# Patient Record
Sex: Female | Born: 1960
Health system: Southern US, Community
[De-identification: ages and names within clinical notes are randomized; demographics above are authoritative.]

## PROBLEM LIST (undated history)

## (undated) DIAGNOSIS — J309 Allergic rhinitis, unspecified: Secondary | ICD-10-CM

## (undated) DIAGNOSIS — E039 Hypothyroidism, unspecified: Secondary | ICD-10-CM

## (undated) DIAGNOSIS — E7800 Pure hypercholesterolemia, unspecified: Secondary | ICD-10-CM

## (undated) DIAGNOSIS — I10 Essential (primary) hypertension: Secondary | ICD-10-CM

## (undated) DIAGNOSIS — N189 Chronic kidney disease, unspecified: Secondary | ICD-10-CM

## (undated) DIAGNOSIS — D649 Anemia, unspecified: Secondary | ICD-10-CM

## (undated) DIAGNOSIS — I878 Other specified disorders of veins: Secondary | ICD-10-CM

## (undated) HISTORY — PX: ROTATOR CUFF REPAIR: SHX139

---

## 2004-01-16 ENCOUNTER — Ambulatory Visit: Payer: Self-pay | Admitting: Internal Medicine

## 2005-06-17 ENCOUNTER — Ambulatory Visit: Payer: Self-pay | Admitting: Physician Assistant

## 2005-07-06 ENCOUNTER — Ambulatory Visit: Payer: Self-pay | Admitting: Unknown Physician Specialty

## 2006-12-27 ENCOUNTER — Ambulatory Visit: Payer: Self-pay | Admitting: Internal Medicine

## 2007-12-29 ENCOUNTER — Ambulatory Visit: Payer: Self-pay | Admitting: Internal Medicine

## 2008-05-22 ENCOUNTER — Ambulatory Visit: Payer: Self-pay | Admitting: Internal Medicine

## 2008-12-30 ENCOUNTER — Ambulatory Visit: Payer: Self-pay | Admitting: Internal Medicine

## 2010-01-02 ENCOUNTER — Ambulatory Visit: Payer: Self-pay | Admitting: Internal Medicine

## 2010-09-17 ENCOUNTER — Ambulatory Visit: Payer: Self-pay | Admitting: Unknown Physician Specialty

## 2010-09-18 LAB — PATHOLOGY REPORT

## 2010-12-03 ENCOUNTER — Emergency Department: Payer: Self-pay | Admitting: Emergency Medicine

## 2011-01-26 ENCOUNTER — Ambulatory Visit: Payer: Self-pay | Admitting: Internal Medicine

## 2012-03-14 ENCOUNTER — Ambulatory Visit: Payer: Self-pay | Admitting: Internal Medicine

## 2013-03-08 HISTORY — PX: BREAST BIOPSY: SHX20

## 2013-03-26 ENCOUNTER — Ambulatory Visit: Payer: Self-pay | Admitting: Internal Medicine

## 2013-04-03 ENCOUNTER — Ambulatory Visit: Payer: Self-pay | Admitting: Internal Medicine

## 2013-09-18 ENCOUNTER — Ambulatory Visit: Payer: Self-pay | Admitting: Internal Medicine

## 2013-09-24 ENCOUNTER — Ambulatory Visit: Payer: Self-pay | Admitting: Internal Medicine

## 2013-09-26 LAB — PATHOLOGY REPORT

## 2013-10-11 ENCOUNTER — Ambulatory Visit: Payer: Self-pay | Admitting: Internal Medicine

## 2014-12-26 ENCOUNTER — Encounter: Payer: Self-pay | Admitting: *Deleted

## 2014-12-27 ENCOUNTER — Ambulatory Visit: Payer: 59 | Admitting: Anesthesiology

## 2014-12-27 ENCOUNTER — Encounter: Payer: Self-pay | Admitting: Anesthesiology

## 2014-12-27 ENCOUNTER — Ambulatory Visit
Admission: RE | Admit: 2014-12-27 | Discharge: 2014-12-27 | Disposition: A | Discharge status: Home / Self Care | Payer: 59 | Source: Ambulatory Visit | Attending: Unknown Physician Specialty | Admitting: Unknown Physician Specialty

## 2014-12-27 ENCOUNTER — Encounter
Admission: RE | Disposition: A | Discharge status: Home / Self Care | Payer: Self-pay | Source: Ambulatory Visit | Attending: Unknown Physician Specialty

## 2014-12-27 DIAGNOSIS — Z79899 Other long term (current) drug therapy: Secondary | ICD-10-CM | POA: Insufficient documentation

## 2014-12-27 DIAGNOSIS — I129 Hypertensive chronic kidney disease with stage 1 through stage 4 chronic kidney disease, or unspecified chronic kidney disease: Secondary | ICD-10-CM | POA: Diagnosis not present

## 2014-12-27 DIAGNOSIS — Z86010 Personal history of colonic polyps: Secondary | ICD-10-CM | POA: Diagnosis present

## 2014-12-27 DIAGNOSIS — E039 Hypothyroidism, unspecified: Secondary | ICD-10-CM | POA: Diagnosis not present

## 2014-12-27 DIAGNOSIS — Z888 Allergy status to other drugs, medicaments and biological substances status: Secondary | ICD-10-CM | POA: Diagnosis not present

## 2014-12-27 DIAGNOSIS — D123 Benign neoplasm of transverse colon: Secondary | ICD-10-CM | POA: Diagnosis not present

## 2014-12-27 DIAGNOSIS — Z880 Allergy status to penicillin: Secondary | ICD-10-CM | POA: Insufficient documentation

## 2014-12-27 DIAGNOSIS — N189 Chronic kidney disease, unspecified: Secondary | ICD-10-CM | POA: Diagnosis not present

## 2014-12-27 DIAGNOSIS — K640 First degree hemorrhoids: Secondary | ICD-10-CM | POA: Insufficient documentation

## 2014-12-27 DIAGNOSIS — J309 Allergic rhinitis, unspecified: Secondary | ICD-10-CM | POA: Diagnosis not present

## 2014-12-27 DIAGNOSIS — D120 Benign neoplasm of cecum: Secondary | ICD-10-CM | POA: Insufficient documentation

## 2014-12-27 DIAGNOSIS — Z87442 Personal history of urinary calculi: Secondary | ICD-10-CM | POA: Diagnosis not present

## 2014-12-27 DIAGNOSIS — E7800 Pure hypercholesterolemia, unspecified: Secondary | ICD-10-CM | POA: Diagnosis not present

## 2014-12-27 DIAGNOSIS — Z87891 Personal history of nicotine dependence: Secondary | ICD-10-CM | POA: Insufficient documentation

## 2014-12-27 HISTORY — PX: COLONOSCOPY WITH PROPOFOL: SHX5780

## 2014-12-27 HISTORY — DX: Allergic rhinitis, unspecified: J30.9

## 2014-12-27 HISTORY — DX: Pure hypercholesterolemia, unspecified: E78.00

## 2014-12-27 HISTORY — DX: Hypothyroidism, unspecified: E03.9

## 2014-12-27 HISTORY — DX: Anemia, unspecified: D64.9

## 2014-12-27 HISTORY — DX: Other specified disorders of veins: I87.8

## 2014-12-27 HISTORY — DX: Chronic kidney disease, unspecified: N18.9

## 2014-12-27 HISTORY — DX: Essential (primary) hypertension: I10

## 2014-12-27 SURGERY — COLONOSCOPY WITH PROPOFOL
Anesthesia: General

## 2014-12-27 MED ORDER — LIDOCAINE HCL (PF) 1 % IJ SOLN
INTRAMUSCULAR | Status: AC
  Administered 2014-12-27: 0.3 mL via INTRADERMAL
  Filled 2014-12-27: qty 2

## 2014-12-27 MED ORDER — LIDOCAINE HCL (PF) 1 % IJ SOLN
2.0000 mL | Freq: Once | INTRAMUSCULAR | Status: AC
  Administered 2014-12-27: 0.3 mL via INTRADERMAL

## 2014-12-27 MED ORDER — PROPOFOL 10 MG/ML IV BOLUS
INTRAVENOUS | Status: DC | PRN
  Administered 2014-12-27: 50 mg via INTRAVENOUS

## 2014-12-27 MED ORDER — FENTANYL CITRATE (PF) 100 MCG/2ML IJ SOLN
INTRAMUSCULAR | Status: DC | PRN
  Administered 2014-12-27: 50 ug via INTRAVENOUS

## 2014-12-27 MED ORDER — MIDAZOLAM HCL 5 MG/5ML IJ SOLN
INTRAMUSCULAR | Status: DC | PRN
  Administered 2014-12-27: 1 mg via INTRAVENOUS

## 2014-12-27 MED ORDER — SODIUM CHLORIDE 0.9 % IV SOLN
INTRAVENOUS | Status: DC
  Administered 2014-12-27: 1000 mL via INTRAVENOUS

## 2014-12-27 MED ORDER — PROPOFOL 500 MG/50ML IV EMUL
INTRAVENOUS | Status: DC | PRN
  Administered 2014-12-27: 100 ug/kg/min via INTRAVENOUS

## 2014-12-27 MED ORDER — LIDOCAINE HCL (PF) 2 % IJ SOLN
INTRAMUSCULAR | Status: DC | PRN
  Administered 2014-12-27: 50 mg

## 2014-12-27 NOTE — Transfer of Care (Signed)
Immediate Anesthesia Transfer of Care Note  Patient: Stephanie Boyle  Procedure(s) Performed: Procedure(s): COLONOSCOPY WITH PROPOFOL (N/A)  Patient Location: PACU  Anesthesia Type:General  Level of Consciousness: sedated  Airway & Oxygen Therapy: Patient Spontanous Breathing and Patient connected to nasal cannula oxygen  Post-op Assessment: Report given to RN and Post -op Vital signs reviewed and stable  Post vital signs: Reviewed and stable  Last Vitals:  Filed Vitals:   12/27/14 1450  BP: 115/72  Pulse: 69  Temp: 36.2 C  Resp:     Complications: No apparent anesthesia complications

## 2014-12-27 NOTE — Anesthesia Postprocedure Evaluation (Signed)
  Anesthesia Post-op Note  Patient: Stephanie Boyle  Procedure(s) Performed: Procedure(s): COLONOSCOPY WITH PROPOFOL (N/A)  Anesthesia type:General  Patient location: PACU  Post pain: Pain level controlled  Post assessment: Post-op Vital signs reviewed, Patient's Cardiovascular Status Stable, Respiratory Function Stable, Patent Airway and No signs of Nausea or vomiting  Post vital signs: Reviewed and stable  Last Vitals:  Filed Vitals:   12/27/14 1520  BP: 111/60  Pulse: 65  Temp:   Resp: 15    Level of consciousness: awake, alert  and patient cooperative  Complications: No apparent anesthesia complications

## 2014-12-27 NOTE — H&P (Signed)
   Primary Care Physician:  Idelle Crouch, MD Primary Gastroenterologist:  Dr. Vira Agar  Pre-Procedure History & Physical: HPI:  Stephanie Boyle is a 54 y.o. female is here for an colonoscopy.   Past Medical History  Diagnosis Date  . Allergic rhinitis   . Anemia     IDA  . Chronic kidney disease     Nephrolithiasis  . Hypothyroidism   . Venous stasis   . Hypertension   . Hypercholesterolemia     Past Surgical History  Procedure Laterality Date  . Cesarean section    . Rotator cuff repair      Prior to Admission medications   Medication Sig Start Date End Date Taking? Authorizing Provider  atorvastatin (LIPITOR) 20 MG tablet Take 20 mg by mouth daily.   Yes Historical Provider, MD  hydrochlorothiazide (HYDRODIURIL) 25 MG tablet Take 25 mg by mouth daily.   Yes Historical Provider, MD  levothyroxine (SYNTHROID, LEVOTHROID) 100 MCG tablet Take 100 mcg by mouth daily before breakfast.   Yes Historical Provider, MD  loratadine (CLARITIN) 10 MG tablet Take 10 mg by mouth daily.   Yes Historical Provider, MD  phentermine 37.5 MG capsule Take 37.5 mg by mouth every morning.   Yes Historical Provider, MD    Allergies as of 12/26/2014 - Review Complete 12/26/2014  Allergen Reaction Noted  . Penicillin v potassium Hives and Nausea And Vomiting 12/26/2014    History reviewed. No pertinent family history.  Social History   Social History  . Marital Status: Married    Spouse Name: N/A  . Number of Children: N/A  . Years of Education: N/A   Occupational History  . Not on file.   Social History Main Topics  . Smoking status: Former Research scientist (life sciences)  . Smokeless tobacco: Never Used  . Alcohol Use: No  . Drug Use: No  . Sexual Activity: Not on file   Other Topics Concern  . Not on file   Social History Narrative    Review of Systems: See HPI, otherwise negative ROS  Physical Exam: BP 146/85 mmHg  Pulse 99  Temp(Src) 98.4 F (36.9 C) (Tympanic)  Resp 17  Ht 5\' 1"   (1.549 m)  Wt 93.895 kg (207 lb)  BMI 39.13 kg/m2  SpO2 98% General:   Alert,  pleasant and cooperative in NAD Head:  Normocephalic and atraumatic. Neck:  Supple; no masses or thyromegaly. Lungs:  Clear throughout to auscultation.    Heart:  Regular rate and rhythm. Abdomen:  Soft, nontender and nondistended. Normal bowel sounds, without guarding, and without rebound.   Neurologic:  Alert and  oriented x4;  grossly normal neurologically.  Impression/Plan: Stephanie Boyle is here for an colonoscopy to be performed for  [facility] colon polyps  Risks, benefits, limitations, and alternatives regarding  colonoscopy have been reviewed with the patient.  Questions have been answered.  All parties agreeable.   Gaylyn Cheers, MD  12/27/2014, 2:17 PM

## 2014-12-27 NOTE — Op Note (Signed)
[facility] Gastroenterology Patient Name: Stephanie Boyle Procedure Date: 12/27/2014 2:18 PM MRN: [id number] Account #: [id number] Date of Birth: 05-05-1960 Admit Type: Outpatient Age: 54 Room: [facility] ENDO ROOM 1 Gender: Female Note Status: Finalized Procedure:         Colonoscopy Indications:       Personal history of colonic polyps Providers:         Manya Silvas, MD Referring MD:      Leonie Douglas. Doy Hutching, MD (Referring MD) Medicines:         Propofol per Anesthesia Complications:     No immediate complications. Procedure:         Pre-Anesthesia Assessment:                    - After reviewing the risks and benefits, the patient was                     deemed in satisfactory condition to undergo the procedure.                    After obtaining informed consent, the colonoscope was                     passed under direct vision. Throughout the procedure, the                     patient's blood pressure, pulse, and oxygen saturations                     were monitored continuously. The Colonoscope was                     introduced through the anus and advanced to the the cecum,                     identified by appendiceal orifice and ileocecal valve. The                     colonoscopy was performed without difficulty. The patient                     tolerated the procedure well. The quality of the bowel                     preparation was excellent. Findings:      A small polyp was found in the cecum. The polyp was sessile. The polyp       was removed with a cold snare. Resection and retrieval were complete.      A small-medium polyp was found in the distal transverse colon. The polyp       was sessile. The polyp was removed with a hot snare. Resection and       retrieval were complete.      Internal hemorrhoids were found during endoscopy. The hemorrhoids were       medium-sized and Grade I (internal hemorrhoids that do not prolapse). Impression:         - One small polyp in the cecum. Resected and retrieved.                    - One small polyp in the distal transverse colon. Resected                     and retrieved.                    -  Internal hemorrhoids. Recommendation:    - Await pathology results. Manya Silvas, MD 12/27/2014 2:47:21 PM This report has been signed electronically. Number of Addenda: 0 Note Initiated On: 12/27/2014 2:18 PM Scope Withdrawal Time: 0 hours 15 minutes 28 seconds  Total Procedure Duration: 0 hours 19 minutes 55 seconds       [facility]

## 2014-12-27 NOTE — Anesthesia Preprocedure Evaluation (Signed)
Anesthesia Evaluation  Patient identified by MRN, date of birth, ID band Patient awake    Reviewed: Allergy & Precautions, H&P , NPO status , Patient's Chart, lab work & pertinent test results, reviewed documented beta blocker date and time   History of Anesthesia Complications Negative for: history of anesthetic complications  Airway Mallampati: II  TM Distance: >3 FB Neck ROM: full    Dental no notable dental hx. (+) Teeth Intact   Pulmonary neg shortness of breath, asthma (no albuterol use in a year) , neg sleep apnea, neg COPD, neg recent URI, former smoker,    Pulmonary exam normal breath sounds clear to auscultation       Cardiovascular Exercise Tolerance: Good hypertension, On Medications (-) angina(-) CAD, (-) Past MI, (-) Cardiac Stents and (-) CABG Normal cardiovascular exam(-) dysrhythmias (-) Valvular Problems/Murmurs Rhythm:regular Rate:Normal     Neuro/Psych negative neurological ROS  negative psych ROS   GI/Hepatic negative GI ROS, Neg liver ROS,   Endo/Other  neg diabetesHypothyroidism Morbid obesity  Renal/GU Renal disease (kidney stones)  negative genitourinary   Musculoskeletal   Abdominal   Peds  Hematology negative hematology ROS (+)   Anesthesia Other Findings Past Medical History:   Allergic rhinitis                                            Anemia                                                         Comment:IDA   Chronic kidney disease                                         Comment:Nephrolithiasis   Hypothyroidism                                               Venous stasis                                                Hypertension                                                 Hypercholesterolemia                                         Reproductive/Obstetrics negative OB ROS                             Anesthesia Physical Anesthesia Plan  ASA:  III  Anesthesia Plan: General   Post-op Pain Management:    Induction:  Airway Management Planned:   Additional Equipment:   Intra-op Plan:   Post-operative Plan:   Informed Consent: I have reviewed the patients History and Physical, chart, labs and discussed the procedure including the risks, benefits and alternatives for the proposed anesthesia with the patient or authorized representative who has indicated his/her understanding and acceptance.   Dental Advisory Given  Plan Discussed with: Anesthesiologist, CRNA and Surgeon  Anesthesia Plan Comments:         Anesthesia Quick Evaluation

## 2014-12-31 LAB — SURGICAL PATHOLOGY

## 2015-01-02 ENCOUNTER — Encounter: Payer: Self-pay | Admitting: Unknown Physician Specialty

## 2015-04-30 ENCOUNTER — Other Ambulatory Visit: Payer: Self-pay | Admitting: Internal Medicine

## 2015-04-30 DIAGNOSIS — Z1231 Encounter for screening mammogram for malignant neoplasm of breast: Secondary | ICD-10-CM

## 2015-05-12 ENCOUNTER — Ambulatory Visit
Admission: RE | Admit: 2015-05-12 | Discharge: 2015-05-12 | Disposition: A | Discharge status: Home / Self Care | Payer: 59 | Source: Ambulatory Visit | Attending: Internal Medicine | Admitting: Internal Medicine

## 2015-05-12 DIAGNOSIS — Z1231 Encounter for screening mammogram for malignant neoplasm of breast: Secondary | ICD-10-CM | POA: Diagnosis present

## 2017-07-19 ENCOUNTER — Other Ambulatory Visit: Payer: Self-pay | Admitting: Internal Medicine

## 2017-07-19 DIAGNOSIS — Z1231 Encounter for screening mammogram for malignant neoplasm of breast: Secondary | ICD-10-CM

## 2017-08-05 ENCOUNTER — Ambulatory Visit
Admission: RE | Admit: 2017-08-05 | Discharge: 2017-08-05 | Disposition: A | Discharge status: Home / Self Care | Payer: BLUE CROSS/BLUE SHIELD | Source: Ambulatory Visit | Attending: Internal Medicine | Admitting: Internal Medicine

## 2017-08-05 DIAGNOSIS — Z1231 Encounter for screening mammogram for malignant neoplasm of breast: Secondary | ICD-10-CM | POA: Diagnosis present

## 2018-10-01 IMAGING — MG MM DIGITAL SCREENING BILAT W/ TOMO W/ CAD
8 series · 8 of 24 positions shown · non-contrast
Comparison: Previous exam(s).

CLINICAL DATA: Screening.

EXAM:
DIGITAL SCREENING BILATERAL MAMMOGRAM WITH TOMO AND CAD

[R CC synth-2D]
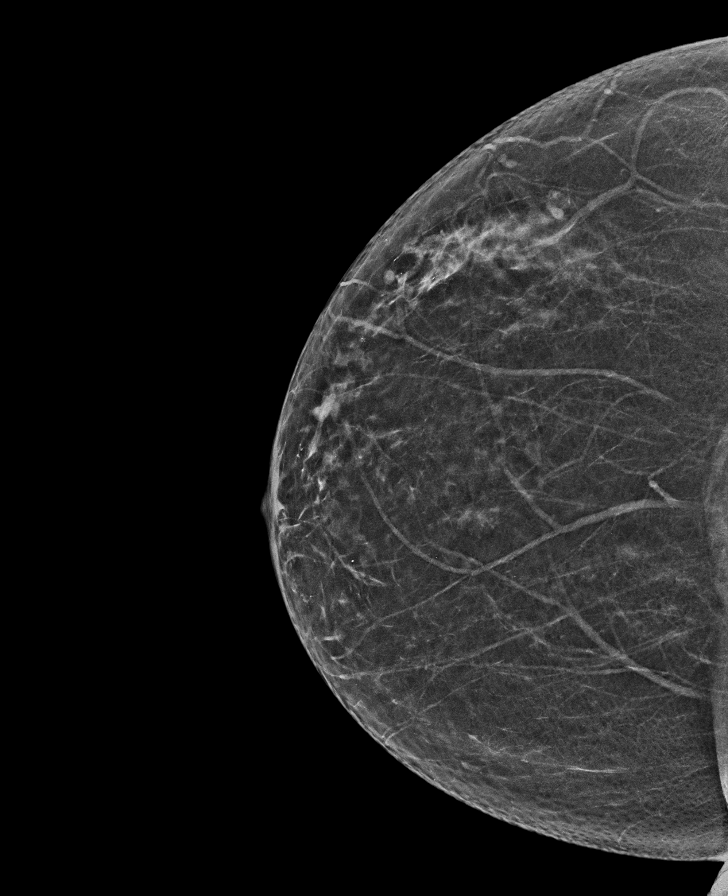

[L MLO synth-2D]
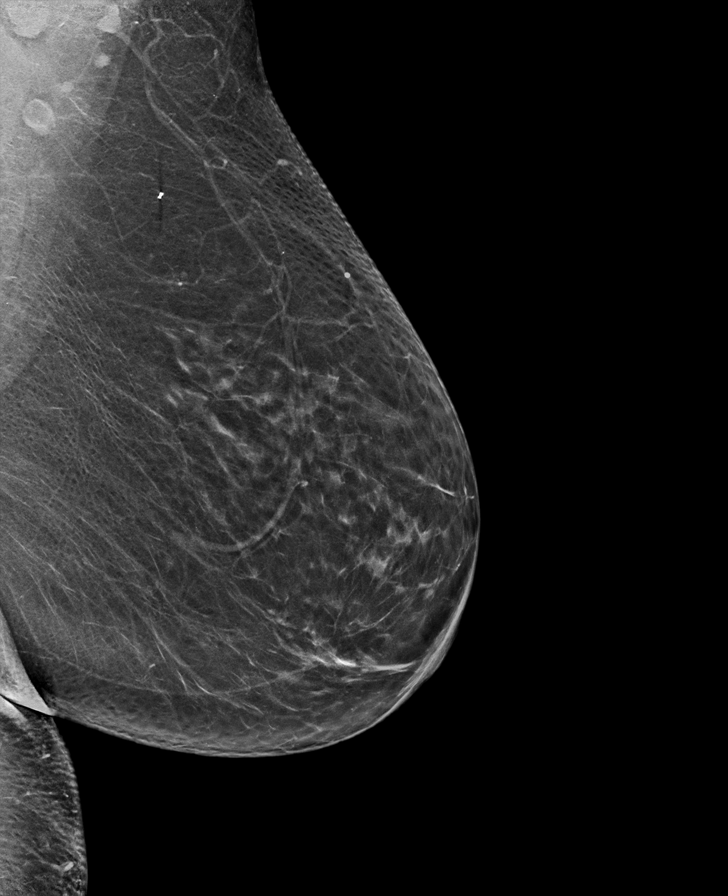

[R MLO synth-2D]
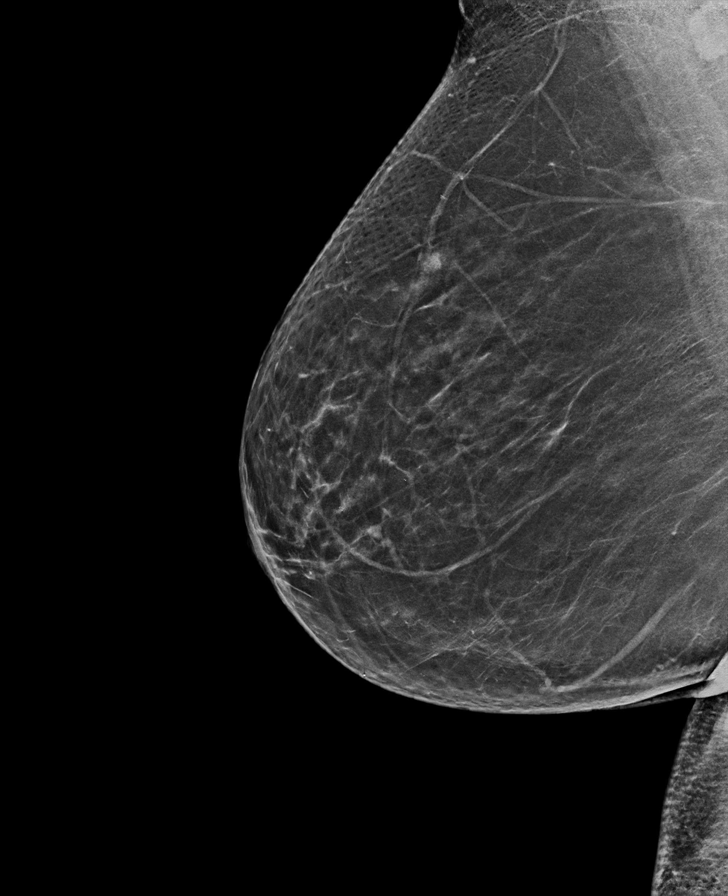

[L CC synth-2D]
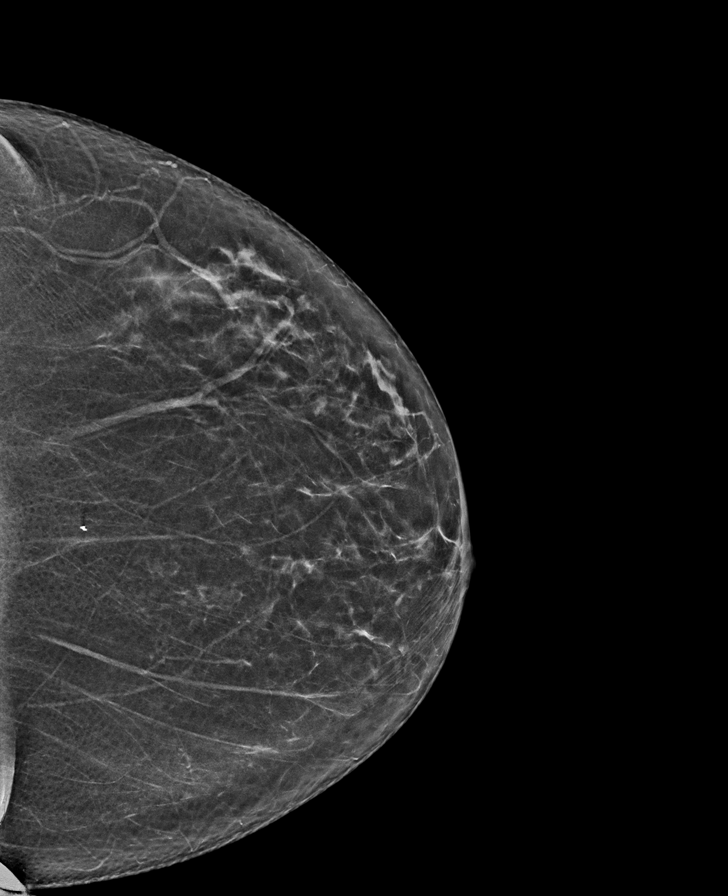

[R MLO tomo · tomo slice 35/68.0]
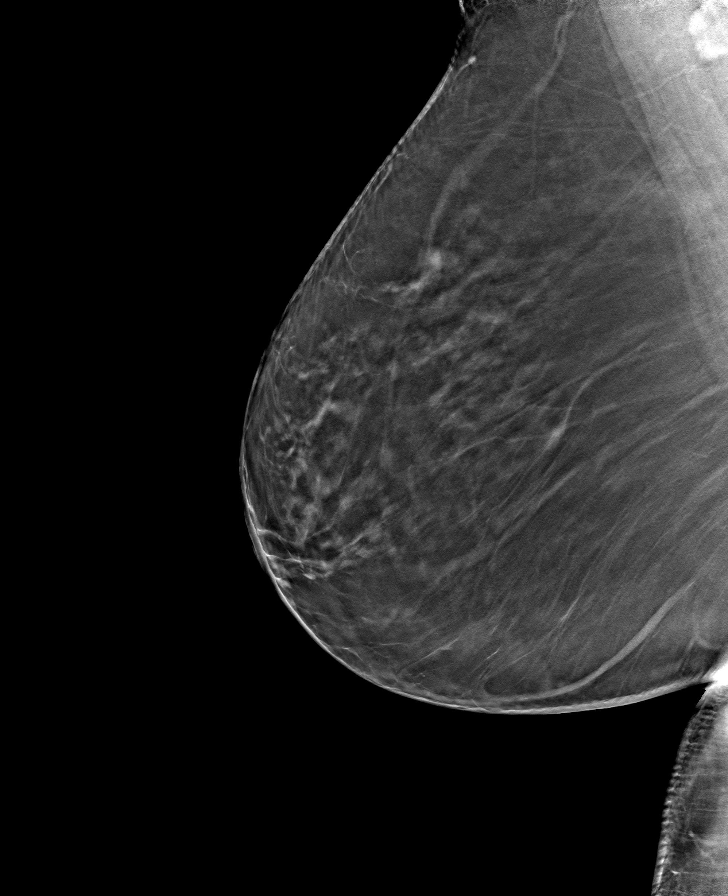

[R CC tomo · tomo slice 30/59.0]
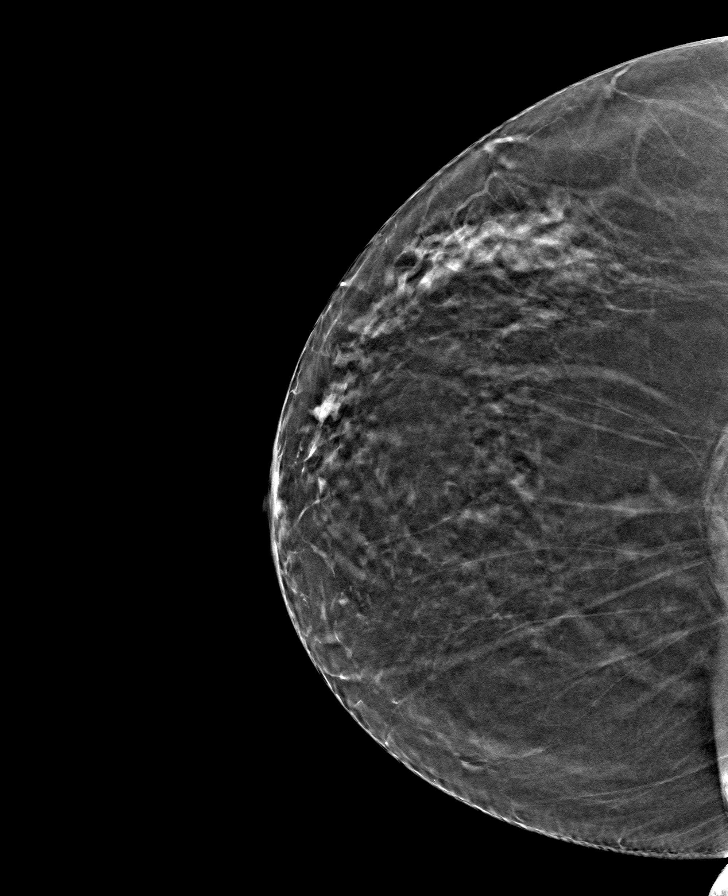

[L CC tomo · tomo slice 33/64.0]
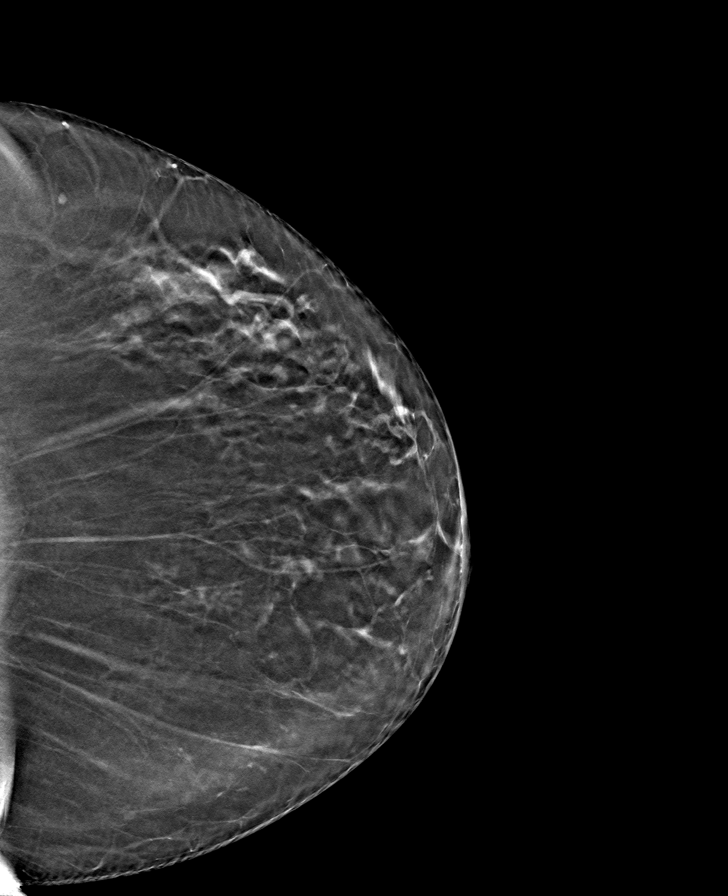

[L MLO tomo · tomo slice 36/71.0]
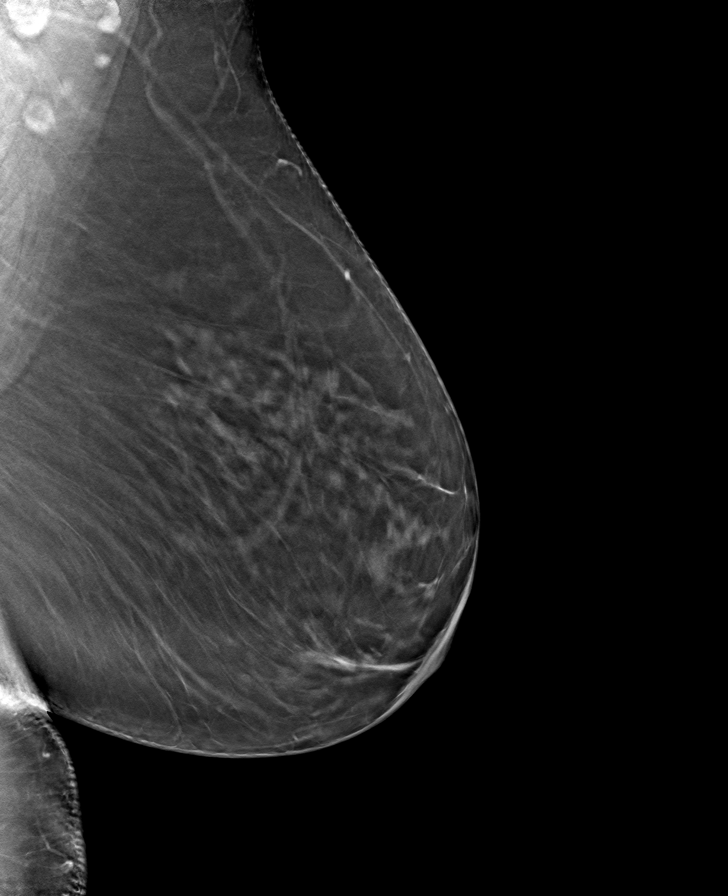

[8 of 24 positions shown; findings below may reference images not displayed]

ACR Breast Density Category b: There are scattered areas of
fibroglandular density.
FINDINGS: There are no findings suspicious for malignancy. Images were
processed with CAD.
IMPRESSION: No mammographic evidence of malignancy. A result letter of this
screening mammogram will be mailed directly to the patient.

RECOMMENDATION:
Screening mammogram in one year. (Code:[id number]-775)

BI-RADS CATEGORY  1: Negative.

## 2019-01-11 ENCOUNTER — Other Ambulatory Visit: Payer: Self-pay | Admitting: Internal Medicine

## 2019-01-11 DIAGNOSIS — Z1231 Encounter for screening mammogram for malignant neoplasm of breast: Secondary | ICD-10-CM

## 2019-01-26 ENCOUNTER — Encounter: Payer: Self-pay | Admitting: *Deleted

## 2019-03-05 ENCOUNTER — Other Ambulatory Visit: Payer: Self-pay

## 2019-03-05 ENCOUNTER — Telehealth: Payer: Self-pay

## 2019-03-05 DIAGNOSIS — Z1211 Encounter for screening for malignant neoplasm of colon: Secondary | ICD-10-CM

## 2019-03-05 DIAGNOSIS — Z800 Family history of malignant neoplasm of digestive organs: Secondary | ICD-10-CM

## 2019-03-05 DIAGNOSIS — K635 Polyp of colon: Secondary | ICD-10-CM

## 2019-03-05 MED ORDER — NA SULFATE-K SULFATE-MG SULF 17.5-3.13-1.6 GM/177ML PO SOLN: 1.0000 | Freq: Once | ORAL | 0 refills | Status: AC

## 2019-03-05 NOTE — Telephone Encounter (Signed)
Gastroenterology Pre-Procedure Review  Request Date: 03/21/18 Requesting Physician: Dr. Allen Norris  PATIENT REVIEW QUESTIONS: The patient responded to the following health history questions as indicated:    1. Are you having any GI issues? no 2. Do you have a personal history of Polyps? yes (2016 sessile polyp noted) 3. Do you have a family history of Colon Cancer or Polyps? yes (grandfather had colon cancer) 4. Diabetes Mellitus? no 5. Joint replacements in the past 12 months?no 6. Major health problems in the past 3 months?no 7. Any artificial heart valves, MVP, or defibrillator?no    MEDICATIONS & ALLERGIES:    Patient reports the following regarding taking any anticoagulation/antiplatelet therapy:   Plavix, Coumadin, Eliquis, Xarelto, Lovenox, Pradaxa, Brilinta, or Effient? no Aspirin? no  Patient confirms/reports the following medications:  Current Outpatient Medications  Medication Sig Dispense Refill  . atorvastatin (LIPITOR) 20 MG tablet Take 20 mg by mouth daily.    . hydrochlorothiazide (HYDRODIURIL) 25 MG tablet Take 25 mg by mouth daily.    Marland Kitchen levothyroxine (SYNTHROID, LEVOTHROID) 100 MCG tablet Take 100 mcg by mouth daily before breakfast.    . loratadine (CLARITIN) 10 MG tablet Take 10 mg by mouth daily.    . phentermine 37.5 MG capsule Take 37.5 mg by mouth every morning.     No current facility-administered medications for this visit.    Patient confirms/reports the following allergies:  Allergies  Allergen Reactions  . Penicillin V Potassium Hives and Nausea And Vomiting    No orders of the defined types were placed in this encounter.   AUTHORIZATION INFORMATION Primary Insurance: 1D#: Group #:  Secondary Insurance: 1D#: Group #:  SCHEDULE INFORMATION: Date:  Time: Location:

## 2019-03-05 NOTE — Telephone Encounter (Signed)
Patient LVM for me to call office in regard to scheduling her colonoscopy with Dr. Allen Norris on January 13th or January 14th.  January 14th Thursday is available with Dr. Allen Norris at [facility].  LVM for her to call me back to schedule.  Thanks [employer]

## 2019-03-19 ENCOUNTER — Other Ambulatory Visit
Admission: RE | Admit: 2019-03-19 | Discharge: 2019-03-19 | Disposition: A | Discharge status: Home / Self Care | Payer: 59 | Source: Ambulatory Visit | Attending: Gastroenterology | Admitting: Gastroenterology

## 2019-03-19 ENCOUNTER — Other Ambulatory Visit: Payer: Self-pay

## 2019-03-19 DIAGNOSIS — Z01812 Encounter for preprocedural laboratory examination: Secondary | ICD-10-CM | POA: Insufficient documentation

## 2019-03-19 DIAGNOSIS — Z20822 Contact with and (suspected) exposure to covid-19: Secondary | ICD-10-CM | POA: Insufficient documentation

## 2019-03-19 LAB — SARS CORONAVIRUS 2 (TAT 6-24 HRS): SARS Coronavirus 2: NEGATIVE

## 2019-03-20 ENCOUNTER — Other Ambulatory Visit: Payer: 59

## 2019-03-22 ENCOUNTER — Ambulatory Visit: Admit: 2019-03-22 | Payer: PRIVATE HEALTH INSURANCE | Admitting: Gastroenterology

## 2019-03-22 ENCOUNTER — Ambulatory Visit: Payer: 59 | Admitting: Certified Registered Nurse Anesthetist

## 2019-03-22 ENCOUNTER — Encounter
Admission: RE | Disposition: A | Discharge status: Home / Self Care | Payer: Self-pay | Source: Home / Self Care | Attending: Gastroenterology

## 2019-03-22 ENCOUNTER — Other Ambulatory Visit: Payer: Self-pay

## 2019-03-22 ENCOUNTER — Encounter: Payer: Self-pay | Admitting: Gastroenterology

## 2019-03-22 ENCOUNTER — Ambulatory Visit
Admission: RE | Admit: 2019-03-22 | Discharge: 2019-03-22 | Disposition: A | Discharge status: Home / Self Care | Payer: 59 | Attending: Gastroenterology | Admitting: Gastroenterology

## 2019-03-22 DIAGNOSIS — Z79890 Hormone replacement therapy (postmenopausal): Secondary | ICD-10-CM | POA: Insufficient documentation

## 2019-03-22 DIAGNOSIS — D124 Benign neoplasm of descending colon: Secondary | ICD-10-CM | POA: Insufficient documentation

## 2019-03-22 DIAGNOSIS — Z87891 Personal history of nicotine dependence: Secondary | ICD-10-CM | POA: Diagnosis not present

## 2019-03-22 DIAGNOSIS — N189 Chronic kidney disease, unspecified: Secondary | ICD-10-CM | POA: Diagnosis not present

## 2019-03-22 DIAGNOSIS — K635 Polyp of colon: Secondary | ICD-10-CM

## 2019-03-22 DIAGNOSIS — Z860100 Personal history of colon polyps, unspecified: Secondary | ICD-10-CM

## 2019-03-22 DIAGNOSIS — E7800 Pure hypercholesterolemia, unspecified: Secondary | ICD-10-CM | POA: Diagnosis not present

## 2019-03-22 DIAGNOSIS — Z79899 Other long term (current) drug therapy: Secondary | ICD-10-CM | POA: Insufficient documentation

## 2019-03-22 DIAGNOSIS — E039 Hypothyroidism, unspecified: Secondary | ICD-10-CM | POA: Diagnosis not present

## 2019-03-22 DIAGNOSIS — Z800 Family history of malignant neoplasm of digestive organs: Secondary | ICD-10-CM | POA: Diagnosis not present

## 2019-03-22 DIAGNOSIS — K648 Other hemorrhoids: Secondary | ICD-10-CM | POA: Insufficient documentation

## 2019-03-22 DIAGNOSIS — I129 Hypertensive chronic kidney disease with stage 1 through stage 4 chronic kidney disease, or unspecified chronic kidney disease: Secondary | ICD-10-CM | POA: Diagnosis not present

## 2019-03-22 HISTORY — PX: COLONOSCOPY WITH PROPOFOL: SHX5780

## 2019-03-22 SURGERY — COLONOSCOPY WITH PROPOFOL
Anesthesia: General

## 2019-03-22 MED ORDER — PROPOFOL 500 MG/50ML IV EMUL
INTRAVENOUS | Status: DC | PRN
  Administered 2019-03-22: 150 ug/kg/min via INTRAVENOUS

## 2019-03-22 MED ORDER — LIDOCAINE HCL (CARDIAC) PF 100 MG/5ML IV SOSY
PREFILLED_SYRINGE | INTRAVENOUS | Status: DC | PRN
  Administered 2019-03-22: 40 mg via INTRATRACHEAL

## 2019-03-22 MED ORDER — LIDOCAINE HCL (PF) 2 % IJ SOLN
INTRAMUSCULAR | Status: AC
  Filled 2019-03-22: qty 10

## 2019-03-22 MED ORDER — SODIUM CHLORIDE 0.9 % IV SOLN
INTRAVENOUS | Status: DC
  Administered 2019-03-22: 1000 mL via INTRAVENOUS

## 2019-03-22 MED ORDER — PROPOFOL 10 MG/ML IV BOLUS
INTRAVENOUS | Status: DC | PRN
  Administered 2019-03-22: 50 mg via INTRAVENOUS

## 2019-03-22 MED ORDER — PROPOFOL 10 MG/ML IV BOLUS
INTRAVENOUS | Status: AC
  Filled 2019-03-22: qty 20

## 2019-03-22 NOTE — Anesthesia Preprocedure Evaluation (Signed)
Anesthesia Evaluation  Patient identified by MRN, date of birth, ID band Patient awake    Reviewed: Allergy & Precautions, NPO status , Patient's Chart, lab work & pertinent test results  History of Anesthesia Complications Negative for: history of anesthetic complications  Airway Mallampati: I  TM Distance: >3 FB Neck ROM: Full    Dental no notable dental hx. (+) Teeth Intact   Pulmonary neg pulmonary ROS, neg sleep apnea, neg COPD, Patient abstained from smoking.Not current smoker, former smoker,    Pulmonary exam normal breath sounds clear to auscultation       Cardiovascular Exercise Tolerance: Good METShypertension, (-) CAD and (-) Past MI negative cardio ROS  (-) dysrhythmias  Rhythm:Regular Rate:Normal - Systolic murmurs    Neuro/Psych negative neurological ROS  negative psych ROS   GI/Hepatic neg GERD  ,(+)     (-) substance abuse  ,   Endo/Other  neg diabetesHypothyroidism   Renal/GU negative Renal ROS     Musculoskeletal   Abdominal   Peds  Hematology  (+) anemia ,   Anesthesia Other Findings Past Medical History: No date: Allergic rhinitis No date: Anemia     Comment:  IDA No date: Chronic kidney disease     Comment:  Nephrolithiasis No date: Hypercholesterolemia No date: Hypertension No date: Hypothyroidism No date: Venous stasis  Reproductive/Obstetrics                             Anesthesia Physical Anesthesia Plan  ASA: II  Anesthesia Plan: General   Post-op Pain Management:    Induction: Intravenous  PONV Risk Score and Plan: 3 and Ondansetron, Propofol infusion and TIVA  Airway Management Planned: Nasal Cannula  Additional Equipment: None  Intra-op Plan:   Post-operative Plan:   Informed Consent: I have reviewed the patients History and Physical, chart, labs and discussed the procedure including the risks, benefits and alternatives for the  proposed anesthesia with the patient or authorized representative who has indicated his/her understanding and acceptance.     Dental advisory given  Plan Discussed with: CRNA and Surgeon  Anesthesia Plan Comments: (Discussed risks of anesthesia with patient, including possibility of difficulty with spontaneous ventilation under anesthesia necessitating airway intervention, PONV, and rare risks such as cardiac or respiratory events. Patient understands.)        Anesthesia Quick Evaluation

## 2019-03-22 NOTE — Op Note (Signed)
[facility] Gastroenterology Patient Name: Stephanie Boyle Procedure Date: 03/22/2019 1:59 PM MRN: [id number] Account #: [id number] Date of Birth: 1960-12-22 Admit Type: Outpatient Age: 59 Room: [facility] ENDO ROOM 3 Gender: Female Note Status: Finalized Procedure:             Colonoscopy Indications:           High risk colon cancer surveillance: Personal history                         of adenomatous colonic polyps 12/27/2014 Providers:             Lucilla Lame MD, MD Referring MD:          Leonie Douglas. Doy Hutching, MD (Referring MD) Medicines:             Propofol per Anesthesia Complications:         No immediate complications. Procedure:             Pre-Anesthesia Assessment:                        - Prior to the procedure, a History and Physical was                         performed, and patient medications and allergies were                         reviewed. The patient's tolerance of previous                         anesthesia was also reviewed. The risks and benefits                         of the procedure and the sedation options and risks                         were discussed with the patient. All questions were                         answered, and informed consent was obtained. Prior                         Anticoagulants: The patient has taken no previous                         anticoagulant or antiplatelet agents. ASA Grade                         Assessment: II - A patient with mild systemic disease.                         After reviewing the risks and benefits, the patient                         was deemed in satisfactory condition to undergo the                         procedure.  After obtaining informed consent, the colonoscope was                         passed under direct vision. Throughout the procedure,                         the patient's blood pressure, pulse, and oxygen                         saturations were monitored  continuously. The                         Colonoscope was introduced through the anus and                         advanced to the the cecum, identified by appendiceal                         orifice and ileocecal valve. The colonoscopy was                         performed without difficulty. The patient tolerated                         the procedure well. The quality of the bowel                         preparation was excellent. Findings:      The perianal and digital rectal examinations were normal.      A 5 mm polyp was found in the descending colon. The polyp was sessile.       The polyp was removed with a cold snare. Resection and retrieval were       complete.      Non-bleeding internal hemorrhoids were found during retroflexion. The       hemorrhoids were Grade I (internal hemorrhoids that do not prolapse). Impression:            - One 5 mm polyp in the descending colon, removed with                         a cold snare. Resected and retrieved.                        - Non-bleeding internal hemorrhoids. Recommendation:        - Discharge patient to home.                        - Resume previous diet.                        - Continue present medications.                        - Await pathology results.                        - Repeat colonoscopy in 5 years for surveillance. Procedure Code(s):     --- Professional ---  45385, Colonoscopy, flexible; with removal of                         tumor(s), polyp(s), or other lesion(s) by snare                         technique Diagnosis Code(s):     --- Professional ---                        Z86.010, Personal history of colonic polyps                        K63.5, Polyp of colon CPT copyright 2019 American Medical Association. All rights reserved. The codes documented in this report are preliminary and upon coder review may  be revised to meet current compliance requirements. Lucilla Lame MD, MD 03/22/2019  2:22:14 PM This report has been signed electronically. Number of Addenda: 0 Note Initiated On: 03/22/2019 1:59 PM Scope Withdrawal Time: 0 hours 8 minutes 43 seconds  Total Procedure Duration: 0 hours 11 minutes 8 seconds  Estimated Blood Loss:  Estimated blood loss: none.      [facility]

## 2019-03-22 NOTE — H&P (Signed)
Stephanie Lame, MD [address] [place], [place] [address] Phone:[phone] Fax : [phone]  Primary Care Physician:  Idelle Crouch, MD Primary Gastroenterologist:  Dr. Allen Norris  Pre-Procedure History & Physical: HPI:  Stephanie Boyle is a 59 y.o. female is here for an colonoscopy.   Past Medical History:  Diagnosis Date  . Allergic rhinitis   . Anemia    IDA  . Chronic kidney disease    Nephrolithiasis  . Hypercholesterolemia   . Hypertension   . Hypothyroidism   . Venous stasis     Past Surgical History:  Procedure Laterality Date  . BREAST BIOPSY Left 2015   stereo - neg  . CESAREAN SECTION    . COLONOSCOPY WITH PROPOFOL N/A 12/27/2014   Procedure: COLONOSCOPY WITH PROPOFOL;  Surgeon: Manya Silvas, MD;  Location: [facility] ENDOSCOPY;  Service: Endoscopy;  Laterality: N/A;  . ROTATOR CUFF REPAIR      Prior to Admission medications   Medication Sig Start Date End Date Taking? Authorizing Provider  atorvastatin (LIPITOR) 20 MG tablet Take 20 mg by mouth daily.   Yes [provider]  hydrochlorothiazide (HYDRODIURIL) 25 MG tablet Take 25 mg by mouth daily.   Yes [provider]  levothyroxine (SYNTHROID, LEVOTHROID) 100 MCG tablet Take 100 mcg by mouth daily before breakfast.   Yes [provider]  loratadine (CLARITIN) 10 MG tablet Take 10 mg by mouth daily.   Yes [provider]  phentermine 37.5 MG capsule Take 37.5 mg by mouth every morning.   Yes [provider]    Allergies as of 03/05/2019 - Review Complete 12/27/2014  Allergen Reaction Noted  . Penicillin v potassium Hives and Nausea And Vomiting 12/26/2014    Family History  Problem Relation Age of Onset  . Breast cancer Cousin        maternal and paternal    Social History   Socioeconomic History  . Marital status: Married    Spouse name: Not on file  . Number of children: Not on file  . Years of education: Not on file  . Highest  education level: Not on file  Occupational History  . Not on file  Tobacco Use  . Smoking status: Former Research scientist (life sciences)  . Smokeless tobacco: Never Used  Substance and Sexual Activity  . Alcohol use: No  . Drug use: No  . Sexual activity: Not on file  Other Topics Concern  . Not on file  Social History Narrative  . Not on file   Social Determinants of Health   Financial Resource Strain:   . Difficulty of Paying Living Expenses: Not on file  Food Insecurity:   . Worried About Charity fundraiser in the Last Year: Not on file  . Ran Out of Food in the Last Year: Not on file  Transportation Needs:   . Lack of Transportation (Medical): Not on file  . Lack of Transportation (Non-Medical): Not on file  Physical Activity:   . Days of Exercise per Week: Not on file  . Minutes of Exercise per Session: Not on file  Stress:   . Feeling of Stress : Not on file  Social Connections:   . Frequency of Communication with Friends and Family: Not on file  . Frequency of Social Gatherings with Friends and Family: Not on file  . Attends Religious Services: Not on file  . Active Member of Clubs or Organizations: Not on file  . Attends Archivist Meetings:  Not on file  . Marital Status: Not on file  Intimate Partner Violence:   . Fear of Current or Ex-Partner: Not on file  . Emotionally Abused: Not on file  . Physically Abused: Not on file  . Sexually Abused: Not on file    Review of Systems: See HPI, otherwise negative ROS  Physical Exam: BP 133/83   Pulse 79   Temp 98.4 F (36.9 C) (Temporal)   Resp 18   Ht 5\' 1"  (1.549 m)   Wt 88.5 kg   SpO2 99%   BMI 36.84 kg/m  General:   Alert,  pleasant and cooperative in NAD Head:  Normocephalic and atraumatic. Neck:  Supple; no masses or thyromegaly. Lungs:  Clear throughout to auscultation.    Heart:  Regular rate and rhythm. Abdomen:  Soft, nontender and nondistended. Normal bowel sounds, without guarding, and without rebound.     Neurologic:  Alert and  oriented x4;  grossly normal neurologically.  Impression/Plan: Stephanie Boyle is here for an colonoscopy to be performed for history of adenomatous polyps  Risks, benefits, limitations, and alternatives regarding  colonoscopy have been reviewed with the patient.  Questions have been answered.  All parties agreeable.   Stephanie Lame, MD  03/22/2019, 2:31 PM

## 2019-03-22 NOTE — Transfer of Care (Signed)
Immediate Anesthesia Transfer of Care Note  Patient: Stephanie Boyle  Procedure(s) Performed: COLONOSCOPY WITH PROPOFOL (N/A )  Patient Location: PACU  Anesthesia Type:General  Level of Consciousness: awake, alert  and oriented  Airway & Oxygen Therapy: Patient Spontanous Breathing  Post-op Assessment: Report given to RN and Post -op Vital signs reviewed and stable  Post vital signs: Reviewed and stable  Last Vitals:  Vitals Value Taken Time  BP    Temp    Pulse 72 03/22/19 1424  Resp 17 03/22/19 1424  SpO2 99 % 03/22/19 1424  Vitals shown include unvalidated device data.  Last Pain:  Vitals:   03/22/19 1313  TempSrc: Temporal  PainSc: 0-No pain         Complications: No apparent anesthesia complications

## 2019-03-23 NOTE — Anesthesia Postprocedure Evaluation (Signed)
Anesthesia Post Note  Patient: Stephanie Boyle  Procedure(s) Performed: COLONOSCOPY WITH PROPOFOL (N/A )  Patient location during evaluation: Endoscopy Anesthesia Type: General Level of consciousness: awake and alert Pain management: pain level controlled Vital Signs Assessment: post-procedure vital signs reviewed and stable Respiratory status: spontaneous breathing, nonlabored ventilation, respiratory function stable and patient connected to nasal cannula oxygen Cardiovascular status: blood pressure returned to baseline and stable Postop Assessment: no apparent nausea or vomiting Anesthetic complications: no     Last Vitals:  Vitals:   03/22/19 1434 03/22/19 1454  BP:  128/80  Pulse:    Resp: 20   Temp:    SpO2:      Last Pain:  Vitals:   03/22/19 1454  TempSrc:   PainSc: 0-No pain                 Arita Miss

## 2019-03-26 LAB — SURGICAL PATHOLOGY

## 2019-03-27 ENCOUNTER — Encounter: Payer: Self-pay | Admitting: Gastroenterology

## 2019-03-29 ENCOUNTER — Ambulatory Visit
Admission: RE | Admit: 2019-03-29 | Discharge: 2019-03-29 | Disposition: A | Discharge status: Home / Self Care | Payer: 59 | Source: Ambulatory Visit | Attending: Internal Medicine | Admitting: Internal Medicine

## 2019-03-29 DIAGNOSIS — Z1231 Encounter for screening mammogram for malignant neoplasm of breast: Secondary | ICD-10-CM | POA: Diagnosis present

## 2019-09-22 ENCOUNTER — Ambulatory Visit: Payer: 59 | Attending: Internal Medicine

## 2019-09-22 DIAGNOSIS — Z23 Encounter for immunization: Secondary | ICD-10-CM

## 2019-09-22 NOTE — Progress Notes (Signed)
   Covid-19 Vaccination Clinic  Name:  Stephanie Boyle    MRN: [id number] DOB: 05-22-1960  09/22/2019  Stephanie Boyle was observed post Covid-19 immunization for 15 minutes without incident. She was provided with Vaccine Information Sheet and instruction to access the V-Safe system.   Stephanie Boyle was instructed to call 911 with any severe reactions post vaccine: Marland Kitchen Difficulty breathing  . Swelling of face and throat  . A fast heartbeat  . A bad rash all over body  . Dizziness and weakness   Immunizations Administered    Name Date Dose VIS Date Route   Pfizer COVID-19 Vaccine 09/22/2019 11:47 AM 0.3 mL 05/02/2018 Intramuscular   Manufacturer: [place]   Lot: [id number]   [place]: [address]-4

## 2019-10-15 ENCOUNTER — Ambulatory Visit: Payer: 59

## 2019-10-29 ENCOUNTER — Ambulatory Visit: Payer: Self-pay | Attending: Internal Medicine

## 2019-10-29 DIAGNOSIS — Z23 Encounter for immunization: Secondary | ICD-10-CM

## 2019-10-29 NOTE — Progress Notes (Signed)
   Covid-19 Vaccination Clinic  Name:  Stephanie Boyle    MRN: [id number] DOB: 1961-02-19  10/29/2019  [name] was observed post Covid-19 immunization for 15 minutes without incident. She was provided with Vaccine Information Sheet and instruction to access the V-Safe system.   [name] was instructed to call 911 with any severe reactions post vaccine: Marland Kitchen Difficulty breathing  . Swelling of face and throat  . A fast heartbeat  . A bad rash all over body  . Dizziness and weakness   Immunizations Administered    Name Date Dose VIS Date Route   Pfizer COVID-19 Vaccine 10/29/2019  9:14 AM 0.3 mL 05/02/2018 Intramuscular   Manufacturer: [employer]   Lot: [id number]   [place]: [address]-9

## 2020-01-23 ENCOUNTER — Other Ambulatory Visit: Payer: Self-pay | Admitting: Internal Medicine

## 2020-01-23 DIAGNOSIS — Z1231 Encounter for screening mammogram for malignant neoplasm of breast: Secondary | ICD-10-CM

## 2020-05-19 ENCOUNTER — Ambulatory Visit
Admission: RE | Admit: 2020-05-19 | Discharge: 2020-05-19 | Disposition: A | Discharge status: Home / Self Care | Payer: 59 | Source: Ambulatory Visit | Attending: Internal Medicine | Admitting: Internal Medicine

## 2020-05-19 ENCOUNTER — Other Ambulatory Visit: Payer: Self-pay

## 2020-05-19 DIAGNOSIS — Z1231 Encounter for screening mammogram for malignant neoplasm of breast: Secondary | ICD-10-CM | POA: Insufficient documentation

## 2021-09-02 ENCOUNTER — Other Ambulatory Visit: Payer: Self-pay | Admitting: Internal Medicine

## 2021-09-02 DIAGNOSIS — Z1231 Encounter for screening mammogram for malignant neoplasm of breast: Secondary | ICD-10-CM

## 2021-09-25 ENCOUNTER — Ambulatory Visit
Admission: RE | Admit: 2021-09-25 | Discharge: 2021-09-25 | Disposition: A | Discharge status: Home / Self Care | Payer: Commercial Managed Care - PPO | Source: Ambulatory Visit | Attending: Internal Medicine | Admitting: Internal Medicine

## 2021-09-25 DIAGNOSIS — Z1231 Encounter for screening mammogram for malignant neoplasm of breast: Secondary | ICD-10-CM | POA: Diagnosis not present

## 2022-09-14 ENCOUNTER — Other Ambulatory Visit: Payer: Self-pay | Admitting: Internal Medicine

## 2022-09-14 DIAGNOSIS — Z1231 Encounter for screening mammogram for malignant neoplasm of breast: Secondary | ICD-10-CM

## 2023-03-17 ENCOUNTER — Other Ambulatory Visit: Payer: Self-pay

## 2023-03-17 ENCOUNTER — Observation Stay
Admission: EM | Admit: 2023-03-17 | Discharge: 2023-03-18 | Disposition: A | Discharge status: Home / Self Care | Payer: Commercial Managed Care - PPO | Attending: Family Medicine | Admitting: Family Medicine

## 2023-03-17 ENCOUNTER — Emergency Department: Payer: Commercial Managed Care - PPO

## 2023-03-17 ENCOUNTER — Encounter: Payer: Self-pay | Admitting: Emergency Medicine

## 2023-03-17 DIAGNOSIS — Z87891 Personal history of nicotine dependence: Secondary | ICD-10-CM | POA: Insufficient documentation

## 2023-03-17 DIAGNOSIS — E785 Hyperlipidemia, unspecified: Secondary | ICD-10-CM | POA: Diagnosis present

## 2023-03-17 DIAGNOSIS — E039 Hypothyroidism, unspecified: Secondary | ICD-10-CM | POA: Diagnosis present

## 2023-03-17 DIAGNOSIS — N189 Chronic kidney disease, unspecified: Secondary | ICD-10-CM | POA: Insufficient documentation

## 2023-03-17 DIAGNOSIS — I160 Hypertensive urgency: Secondary | ICD-10-CM | POA: Diagnosis not present

## 2023-03-17 DIAGNOSIS — I129 Hypertensive chronic kidney disease with stage 1 through stage 4 chronic kidney disease, or unspecified chronic kidney disease: Secondary | ICD-10-CM | POA: Diagnosis not present

## 2023-03-17 DIAGNOSIS — E669 Obesity, unspecified: Secondary | ICD-10-CM | POA: Diagnosis present

## 2023-03-17 DIAGNOSIS — Z79899 Other long term (current) drug therapy: Secondary | ICD-10-CM | POA: Insufficient documentation

## 2023-03-17 DIAGNOSIS — E038 Other specified hypothyroidism: Secondary | ICD-10-CM

## 2023-03-17 DIAGNOSIS — I161 Hypertensive emergency: Secondary | ICD-10-CM | POA: Diagnosis present

## 2023-03-17 DIAGNOSIS — R0789 Other chest pain: Principal | ICD-10-CM | POA: Insufficient documentation

## 2023-03-17 DIAGNOSIS — R079 Chest pain, unspecified: Secondary | ICD-10-CM | POA: Diagnosis not present

## 2023-03-17 LAB — TROPONIN I (HIGH SENSITIVITY)
Troponin I (High Sensitivity): <2 ng/L (ref <18)
Troponin I (High Sensitivity): 3 ng/L (ref <18)

## 2023-03-17 LAB — BASIC METABOLIC PANEL
Anion gap: 10 (ref 5–15)
BUN: 11 mg/dL (ref 8–23)
CO2: 23 mmol/L (ref 22–32)
Calcium: 9.8 mg/dL (ref 8.9–10.3)
Chloride: 103 mmol/L (ref 98–111)
Creatinine, Ser: 0.68 mg/dL (ref 0.44–1.00)
GFR, Estimated: >60 mL/min (ref >60)
Glucose, Bld: 128 mg/dL — ABNORMAL HIGH (ref 70–99)
Potassium: 3.8 mmol/L (ref 3.5–5.1)
Sodium: 136 mmol/L (ref 135–145)

## 2023-03-17 LAB — CBC
HCT: 44.1 % (ref 36.0–46.0)
Hemoglobin: 15.3 g/dL — ABNORMAL HIGH (ref 12.0–15.0)
MCH: 29.7 pg (ref 26.0–34.0)
MCHC: 34.7 g/dL (ref 30.0–36.0)
MCV: 85.5 fL (ref 80.0–100.0)
Platelets: 239 10*3/uL (ref 150–400)
RBC: 5.16 MIL/uL — ABNORMAL HIGH (ref 3.87–5.11)
RDW: 11.7 % (ref 11.5–15.5)
WBC: 9.3 10*3/uL (ref 4.0–10.5)
nRBC: 0 % (ref 0.0–0.2)

## 2023-03-17 LAB — LIPID PANEL
Cholesterol: 171 mg/dL (ref 0–200)
HDL: 55 mg/dL (ref >40)
LDL Cholesterol: 102 mg/dL — ABNORMAL HIGH (ref 0–99)
Total CHOL/HDL Ratio: 3.1 {ratio}
Triglycerides: 68 mg/dL (ref <150)
VLDL: 14 mg/dL (ref 0–40)

## 2023-03-17 MED ORDER — MORPHINE SULFATE (PF) 2 MG/ML IV SOLN: 2.0000 mg | INTRAVENOUS | Status: DC | PRN

## 2023-03-17 MED ORDER — LABETALOL HCL 5 MG/ML IV SOLN
5.0000 mg | Freq: Once | INTRAVENOUS | Status: AC
  Administered 2023-03-17: 5 mg via INTRAVENOUS
  Filled 2023-03-17: qty 4

## 2023-03-17 MED ORDER — ONDANSETRON HCL 4 MG/2ML IJ SOLN: 4.0000 mg | Freq: Three times a day (TID) | INTRAMUSCULAR | Status: DC | PRN

## 2023-03-17 MED ORDER — ACETAMINOPHEN 325 MG PO TABS: 650.0000 mg | ORAL_TABLET | Freq: Four times a day (QID) | ORAL | Status: DC | PRN

## 2023-03-17 MED ORDER — AMLODIPINE BESYLATE 5 MG PO TABS
5.0000 mg | ORAL_TABLET | Freq: Every day | ORAL | Status: DC
  Administered 2023-03-17 – 2023-03-18 (×2): 5 mg via ORAL
  Filled 2023-03-17 (×2): qty 1

## 2023-03-17 MED ORDER — HYDRALAZINE HCL 20 MG/ML IJ SOLN: 5.0000 mg | INTRAMUSCULAR | Status: DC | PRN

## 2023-03-17 MED ORDER — ASPIRIN 81 MG PO TBEC
81.0000 mg | DELAYED_RELEASE_TABLET | Freq: Every day | ORAL | Status: DC
  Administered 2023-03-18: 81 mg via ORAL
  Filled 2023-03-17: qty 1

## 2023-03-17 MED ORDER — ENOXAPARIN SODIUM 40 MG/0.4ML IJ SOSY
40.0000 mg | PREFILLED_SYRINGE | INTRAMUSCULAR | Status: DC
  Filled 2023-03-17: qty 0.4

## 2023-03-17 MED ORDER — NITROGLYCERIN 0.4 MG SL SUBL
0.4000 mg | SUBLINGUAL_TABLET | SUBLINGUAL | Status: DC | PRN
  Administered 2023-03-17: 0.4 mg via SUBLINGUAL
  Filled 2023-03-17: qty 1

## 2023-03-17 MED ORDER — ASPIRIN 81 MG PO CHEW
324.0000 mg | CHEWABLE_TABLET | Freq: Once | ORAL | Status: AC
Start: 2023-03-17 — End: 2023-03-17
  Administered 2023-03-17: 324 mg via ORAL
  Filled 2023-03-17: qty 4

## 2023-03-17 NOTE — ED Triage Notes (Signed)
 Patient to ED via POV for generalized CP that started around 3pm. Pt states also having SOB and lightheadedness. Denies cardiac hx. States using diet control for HTN- does not take meds for same.

## 2023-03-17 NOTE — ED Provider Notes (Signed)
 [facility] Provider Note    Event Date/Time   First MD Initiated Contact with Patient 03/17/23 2153     (approximate)   History   Chest Pain   HPI  Stephanie Boyle is a 63 y.o. female history of chronic kidney disease and hypertension  Patient reports for about 3 or 4 days she has had fatigue.  No other symptoms then she went to work today and she started having a heavy chest pressure like a squeezing chest discomfort.  No nausea or vomiting.  Does not radiate it is relieved but still slightly present.  She has a distant history of smoking.  No shortness of breath.  No leg swelling no history of blood clots.  No sharp chest pain.  She notes a history of diet controlled hypertension history of hypothyroidism on treatment  No personal history of heart disease.  No headache numbness or weakness     Physical Exam   Triage Vital Signs: ED Triage Vitals  Encounter Vitals Group     BP 03/17/23 1634 (!) 210/84     Systolic BP Percentile --      Diastolic BP Percentile --      Pulse Rate 03/17/23 1634 76     Resp 03/17/23 1634 18     Temp 03/17/23 1634 97.7 F (36.5 C)     Temp Source 03/17/23 1634 Oral     SpO2 03/17/23 1634 100 %     Weight 03/17/23 1635 188 lb (85.3 kg)     Height 03/17/23 1635 5' 1 (1.549 m)     Head Circumference --      Peak Flow --      Pain Score 03/17/23 1635 7     Pain Loc --      Pain Education --      Exclude from Growth Chart --     Most recent vital signs: Vitals:   03/17/23 2245 03/17/23 2300  BP: (!) 165/72 (!) 175/72  Pulse: 64 68  Resp: 13 11  Temp:    SpO2: 100% 98%     General: Awake, no distress.  Very pleasant husband also at bedside CV:  Good peripheral perfusion.  Normal tones and rate.  Denies any chest pain that radiate to the back or radiate elsewhere.  It was a heavy chest pressure that is now mostly alleviated Resp:  Normal effort.  Clear lungs bilateral Abd:  No distention.   Other:  No lower extremity edema.  No abdominal discomfort to palpation   ED Results / Procedures / Treatments   Labs (all labs ordered are listed, but only abnormal results are displayed) Labs Reviewed  BASIC METABOLIC PANEL - Abnormal; Notable for the following components:      Result Value   Glucose, Bld 128 (*)    All other components within normal limits  CBC - Abnormal; Notable for the following components:   RBC 5.16 (*)    Hemoglobin 15.3 (*)    All other components within normal limits  TSH  T4, FREE  URINE DRUG SCREEN, QUALITATIVE ([facility] ONLY)  HEMOGLOBIN A1C  LIPID PANEL  HIV ANTIBODY (ROUTINE TESTING W REFLEX)  BASIC METABOLIC PANEL  APTT  PROTIME-INR  TROPONIN I (HIGH SENSITIVITY)  TROPONIN I (HIGH SENSITIVITY)  TROPONIN I (HIGH SENSITIVITY)     EKG  Is interpreted by me at 2155 Heart rate 70 QRS 80 QTc 410 Favored sinus rhythm without evidence of acute ischemia.  Slight artifact.  Do not see clear evidence of flutter though there is some artifact noted in V1 but in lead II and V5 I see no evidence of this.  Sinus rhythm     RADIOLOGY Chest x-ray inter by me is negative for acute   DG Chest 2 View Result Date: 03/17/2023 CLINICAL DATA:  Chest pain. EXAM: CHEST - 2 VIEW COMPARISON:  None Available. FINDINGS: With no focal consolidation, pleural effusion, or pneumothorax. The cardiac silhouette is within normal limits. No acute osseous pathology. IMPRESSION: No active cardiopulmonary disease. Electronically Signed   By: Vanetta Chou M.D.   On: 03/17/2023 17:12      PROCEDURES:  Critical Care performed: No  Procedures   MEDICATIONS ORDERED IN ED: Medications  nitroGLYCERIN  (NITROSTAT ) SL tablet 0.4 mg (0.4 mg Sublingual Given 03/17/23 2222)  hydrALAZINE  (APRESOLINE ) injection 5 mg (has no administration in time range)  labetalol  (NORMODYNE ) injection 5 mg (has no administration in time range)  aspirin  EC tablet 81 mg (has no administration in  time range)  amLODipine  (NORVASC ) tablet 5 mg (has no administration in time range)  enoxaparin  (LOVENOX ) injection 40 mg (has no administration in time range)  morphine  (PF) 2 MG/ML injection 2 mg (has no administration in time range)  ondansetron  (ZOFRAN ) injection 4 mg (has no administration in time range)  acetaminophen  (TYLENOL ) tablet 650 mg (has no administration in time range)  aspirin  chewable tablet 324 mg (324 mg Oral Given 03/17/23 2220)     IMPRESSION / MDM / ASSESSMENT AND PLAN / ED COURSE  I reviewed the triage vital signs and the nursing notes.                              Differential diagnosis includes, but is not limited to, ACS, aortic dissection, pulmonary embolism, cardiac tamponade, pneumothorax, pneumonia, pericarditis, myocarditis, GI-related causes including esophagitis/gastritis, and musculoskeletal chest wall pain.    Patient presented with markedly elevated blood pressure.  It is slightly downtrending currently 190/96 and she still has some vague sense of chest pressure.  No signs or symptoms of be highly suggestive of thromboembolism dissection.  No ripping tearing or moving pain.  Reassuring clinical assessment with no evidence of ischemia on ECG and initial and second troponin are normal.  She has severe hypertension.  I suspect she may be having hypertensive urgency she does have risk factors for coronary disease and her symptoms of fatigue for a few days followed by episode of heavy chest pressure I am inclined to observe her overnight for further testing and observation of her blood pressure as well as cardiac rule out.  ----------------------------------------- 10:34 PM on 03/17/2023 ----------------------------------------- Blood pressure now 159/97.  Chest discomfort has improved after nitroglycerin .  Continue to monitor will observe.  Consulted hospitalist for further cardiac and hypertension observation.  TSH and free T4 today pending.  At this time there  is no obvious evidence of ACS but unstable angina, anginal equivalent, coronary disease or hypertensive emergency seem moderately likely.  Patient's presentation is most consistent with acute complicated illness / injury requiring diagnostic workup.   The patient is on the cardiac monitor to evaluate for evidence of arrhythmia and/or significant heart rate changes.   Labs interpreted as normal troponin x 2  ----------------------------------------- 11:23 PM on 03/17/2023 ----------------------------------------- Consulted with and patient accepted to hospital service under Dr. Hilma for primary impression at this time hypertensive urgency with moderate risk coronary disease    Patient  and husband understanding agreeable plan for admission     FINAL CLINICAL IMPRESSION(S) / ED DIAGNOSES   Final diagnoses:  Chest pain with moderate risk for cardiac etiology  Hypertensive urgency     Rx / DC Orders   ED Discharge Orders     None        Note:  This document was prepared using Dragon voice recognition software and may include unintentional dictation errors.   Dicky Anes, MD 03/17/23 2324

## 2023-03-17 NOTE — ED Provider Triage Note (Signed)
 Emergency Medicine Provider Triage Evaluation Note  SARABELLE GENSON , a 63 y.o. female  was evaluated in triage.  Pt complains of chest pain, dizziness, shortness of breath Review of Systems  Positive:  Negative:   Physical Exam  BP (!) 210/84   Pulse 76   Temp 97.7 F (36.5 C) (Oral)   Resp 18   Ht 5' 1 (1.549 m)   Wt 85.3 kg   SpO2 100%   BMI 35.52 kg/m  Gen:   Awake, no distress   Resp:  Normal effort  MSK:   Moves extremities without difficulty  Other:    Medical Decision Making  Medically screening exam initiated at 4:46 PM.  Appropriate orders placed.  Othel CHRISTELLA Qian was informed that the remainder of the evaluation will be completed by another provider, this initial triage assessment does not replace that evaluation, and the importance of remaining in the ED until their evaluation is complete.  Patient with chest pain with EKG and labs   Janit Kast, PA-C 03/17/23 1653

## 2023-03-17 NOTE — ED Notes (Signed)
 Patient reports chest tightness has improved

## 2023-03-17 NOTE — H&P (Signed)
 History and Physical    Stephanie Boyle FMW:[id number] DOB: 06/30/1960 DOA: 03/17/2023  Referring MD/NP/PA:   PCP: Auston Reyes BIRCH, MD   Patient coming from:  The patient is coming from home.     Chief Complaint: chest pain  HPI: Stephanie Boyle is a 63 y.o. female with medical history significant of hypertension, hyperlipidemia, hypothyroidism, kidney stone, obesity, who presents with chest pain.  Patient states that her chest pain started this afternoon at about 3 PM, which is located in front chest, pressure and squeezing-like pain, severe, nonradiating.  Associated with mild shortness of breath.  No cough, fever or chills.  Patient has lightheadedness and dizziness.  No nausea, vomiting, diarrhea or abdominal pain.  No symptoms of UTI.  Patient states that she has fatigue in the past several days.  No unilateral numbness or tingling in extremities.  Patient states that she is currently not taking blood pressure medications. She is found to have elevated blood pressure 210/84 in ED.   Data reviewed independently and ED Course: pt was found to have troponin level negative x 3, blood pressure 9.3, GFR> 60, temperature normal, heart rate 84, RR 25, oxygen saturation 98% on room air.  Chest x-ray negative.  Patient is placed on telemetry bed for observation.   EKG: I have personally reviewed.  Sinus rhythm, QTc 408, LAD, poor R wave progression.  Review of Systems:   General: no fevers, chills, no body weight gain, has fatigue HEENT: no blurry vision, hearing changes or sore throat Respiratory: has dyspnea, no coughing, wheezing CV: has chest pain, no palpitations GI: no nausea, vomiting, abdominal pain, diarrhea, constipation GU: no dysuria, burning on urination, increased urinary frequency, hematuria  Ext: no leg edema Neuro: no unilateral weakness, numbness, or tingling, no vision change or hearing loss Skin: no rash, no skin tear. MSK: No muscle spasm, no deformity, no  limitation of range of movement in spin Heme: No easy bruising.  Travel history: No recent long distant travel.   Allergy:  Allergies  Allergen Reactions   Penicillin V Potassium Hives and Nausea And Vomiting    Past Medical History:  Diagnosis Date   Allergic rhinitis    Anemia    IDA   Chronic kidney disease    Nephrolithiasis   Hypercholesterolemia    Hypertension    Hypothyroidism    Venous stasis     Past Surgical History:  Procedure Laterality Date   BREAST BIOPSY Left 2015   stereo - neg   CESAREAN SECTION     COLONOSCOPY WITH PROPOFOL  N/A 12/27/2014   Procedure: COLONOSCOPY WITH PROPOFOL ;  Surgeon: Lamar ONEIDA Holmes, MD;  Location[facility] ENDOSCOPY;  Service: Endoscopy;  Laterality: N/A;   COLONOSCOPY WITH PROPOFOL  N/A 03/22/2019   Procedure: COLONOSCOPY WITH PROPOFOL ;  Surgeon: Jinny Carmine, MD;  Location: [facility] ENDOSCOPY;  Service: Endoscopy;  Laterality: N/A;   ROTATOR CUFF REPAIR      Social History:  reports that she has quit smoking. She has never used smokeless tobacco. She reports that she does not drink alcohol and does not use drugs.  Family History:  Family History  Problem Relation Age of Onset   Breast cancer Cousin        maternal and paternal     Prior to Admission medications   Medication Sig Start Date End Date Taking? Authorizing Provider  atorvastatin  (LIPITOR) 20 MG tablet Take 20 mg by mouth daily.    [provider]  hydrochlorothiazide (HYDRODIURIL) 25 MG tablet  Take 25 mg by mouth daily.    [provider]  levothyroxine  (SYNTHROID , LEVOTHROID) 100 MCG tablet Take 100 mcg by mouth daily before breakfast.    [provider]  loratadine (CLARITIN) 10 MG tablet Take 10 mg by mouth daily.    [provider]  phentermine 37.5 MG capsule Take 37.5 mg by mouth every morning.    [provider]    Physical Exam: Vitals:   03/17/23 2340 03/17/23 2345 03/18/23 0000 03/18/23 0015  BP: (!) 191/80 (!)  190/85 (!) 180/85 (!) 142/74  Pulse: 63 62 68 71  Resp: 13 15 14 17   Temp:      TempSrc:      SpO2: 100% 100% 100% 100%  Weight:      Height:       General: Not in acute distress HEENT:       Eyes: PERRL, EOMI, no jaundice       ENT: No discharge from the ears and nose, no pharynx injection, no tonsillar enlargement.        Neck: No JVD, no bruit, no mass felt. Heme: No neck lymph node enlargement. Cardiac: S1/S2, RRR, No murmurs, No gallops or rubs. Respiratory: No rales, wheezing, rhonchi or rubs. GI: Soft, nondistended, nontender, no rebound pain, no organomegaly, BS present. GU: No hematuria Ext: No pitting leg edema bilaterally. 1+DP/PT pulse bilaterally. Musculoskeletal: No joint deformities, No joint redness or warmth, no limitation of ROM in spin. Skin: No rashes.  Neuro: Alert, oriented X3, cranial nerves II-XII grossly intact, moves all extremities normally.  Psych: Patient is not psychotic, no suicidal or hemocidal ideation.  Labs on Admission: I have personally reviewed following labs and imaging studies  CBC: Recent Labs  Lab 03/17/23 1638  WBC 9.3  HGB 15.3*  HCT 44.1  MCV 85.5  PLT 239   Basic Metabolic Panel: Recent Labs  Lab 03/17/23 1638  NA 136  K 3.8  CL 103  CO2 23  GLUCOSE 128*  BUN 11  CREATININE 0.68  CALCIUM  9.8   GFR: Estimated Creatinine Clearance: 71.4 mL/min (by C-G formula based on SCr of 0.68 mg/dL). Liver Function Tests: No results for input(s): AST, ALT, ALKPHOS, BILITOT, PROT, ALBUMIN in the last 168 hours. No results for input(s): LIPASE, AMYLASE in the last 168 hours. No results for input(s): AMMONIA in the last 168 hours. Coagulation Profile: Recent Labs  Lab 03/17/23 2338  INR 1.1   Cardiac Enzymes: No results for input(s): CKTOTAL, CKMB, CKMBINDEX, TROPONINI in the last 168 hours. BNP (last 3 results) No results for input(s): PROBNP in the last 8760 hours. HbA1C: No results for  input(s): HGBA1C in the last 72 hours. CBG: No results for input(s): GLUCAP in the last 168 hours. Lipid Profile: Recent Labs    03/17/23 2030  CHOL 171  HDL 55  LDLCALC 102*  TRIG 68  CHOLHDL 3.1   Thyroid  Function Tests: Recent Labs    03/17/23 2215  TSH 2.493  FREET4 1.30*   Anemia Panel: No results for input(s): VITAMINB12, FOLATE, FERRITIN, TIBC, IRON, RETICCTPCT in the last 72 hours. Urine analysis: No results found for: COLORURINE, APPEARANCEUR, LABSPEC, PHURINE, GLUCOSEU, HGBUR, BILIRUBINUR, KETONESUR, PROTEINUR, UROBILINOGEN, NITRITE, LEUKOCYTESUR Sepsis Labs: @LABRCNTIP (procalcitonin:4,lacticidven:4) )No results found for this or any previous visit (from the past 240 hours).   Radiological Exams on Admission:   Assessment/Plan Principal Problem:   Chest pain Active Problems:   Hypertensive emergency   Hypothyroidism   HLD (hyperlipidemia)   Obesity (BMI  30-39.9)   Assessment and Plan:  Chest pain: Most likely due to hypertensive urgency.  Troponin negative x 3.  Patient's blood pressure has improved after treated with labetalol  IV.  Her shortness of breath and chest pain have resolved.  -Placed on telemetry bed for observation -As needed nitroglycerin , morphine  -Aspirin  and Lipitor -Check A1c, FP -Check UDS  Hypertensive emergency: Blood pressure 120/84 --> 152/79 -Give 5 mg of IV labetalol  -Start amlodipine  5 mg daily -IV hydralazine  as needed  Hypothyroidism: Patient states that her Synthroid  dose has recently been increased from 100 to 150 mcg.  Her TSH is normal 2.493, T4 slightly elevated 130. -Continue home Synthroid  -Follow-up with PCP to repeat TSH and free T4  HLD (hyperlipidemia) -Lipitor  Obesity (BMI 30-39.9): Patient states she is taking healthy diet. -Encourage losing weight -Exercise and healthy diet     DVT ppx: SQ Lovenox   Code Status: Full code    Family Communication: not  done, no family member is at bed side  Disposition Plan:  Anticipate discharge back to previous environment  Consults called:  none  Admission status and Level of care: Telemetry Medical:    for obs      Dispo: The patient is from: Home              Anticipated d/c is to: Home              Anticipated d/c date is: 1 day              Patient currently is not medically stable to d/c.    Severity of Illness:  The appropriate patient status for this patient is OBSERVATION. Observation status is judged to be reasonable and necessary in order to provide the required intensity of service to ensure the patient's safety. The patient's presenting symptoms, physical exam findings, and initial radiographic and laboratory data in the context of their medical condition is felt to place them at decreased risk for further clinical deterioration. Furthermore, it is anticipated that the patient will be medically stable for discharge from the hospital within 2 midnights of admission.        Date of Service 03/18/2023    Caleb Exon [facility]   If 7PM-7AM, please contact night-coverage [email or web address] 03/18/2023, 1:04 AM

## 2023-03-18 ENCOUNTER — Observation Stay (HOSPITAL_BASED_OUTPATIENT_CLINIC_OR_DEPARTMENT_OTHER)
Admit: 2023-03-18 | Discharge: 2023-03-18 | Disposition: A | Discharge status: Home / Self Care | Payer: Commercial Managed Care - PPO | Attending: Family Medicine | Admitting: Family Medicine

## 2023-03-18 ENCOUNTER — Other Ambulatory Visit: Payer: Self-pay

## 2023-03-18 DIAGNOSIS — I161 Hypertensive emergency: Secondary | ICD-10-CM | POA: Diagnosis not present

## 2023-03-18 DIAGNOSIS — E669 Obesity, unspecified: Secondary | ICD-10-CM | POA: Diagnosis not present

## 2023-03-18 DIAGNOSIS — R9431 Abnormal electrocardiogram [ECG] [EKG]: Secondary | ICD-10-CM | POA: Diagnosis not present

## 2023-03-18 DIAGNOSIS — E038 Other specified hypothyroidism: Secondary | ICD-10-CM | POA: Diagnosis not present

## 2023-03-18 LAB — URINE DRUG SCREEN, QUALITATIVE (ARMC ONLY)
Amphetamines, Ur Screen: NOT DETECTED
Barbiturates, Ur Screen: NOT DETECTED
Benzodiazepine, Ur Scrn: NOT DETECTED
Cannabinoid 50 Ng, Ur ~~LOC~~: NOT DETECTED
Cocaine Metabolite,Ur ~~LOC~~: NOT DETECTED
MDMA (Ecstasy)Ur Screen: NOT DETECTED
Methadone Scn, Ur: NOT DETECTED
Opiate, Ur Screen: NOT DETECTED
Phencyclidine (PCP) Ur S: NOT DETECTED
Tricyclic, Ur Screen: NOT DETECTED

## 2023-03-18 LAB — CBC WITH DIFFERENTIAL/PLATELET
Abs Immature Granulocytes: 0.02 10*3/uL (ref 0.00–0.07)
Basophils Absolute: 0 10*3/uL (ref 0.0–0.1)
Basophils Relative: 1 %
Eosinophils Absolute: 0.1 10*3/uL (ref 0.0–0.5)
Eosinophils Relative: 2 %
HCT: 43.5 % (ref 36.0–46.0)
Hemoglobin: 14.6 g/dL (ref 12.0–15.0)
Immature Granulocytes: 0 %
Lymphocytes Relative: 21 %
Lymphs Abs: 1.5 10*3/uL (ref 0.7–4.0)
MCH: 29.8 pg (ref 26.0–34.0)
MCHC: 33.6 g/dL (ref 30.0–36.0)
MCV: 88.8 fL (ref 80.0–100.0)
Monocytes Absolute: 0.5 10*3/uL (ref 0.1–1.0)
Monocytes Relative: 7 %
Neutro Abs: 5 10*3/uL (ref 1.7–7.7)
Neutrophils Relative %: 69 %
Platelets: 229 10*3/uL (ref 150–400)
RBC: 4.9 MIL/uL (ref 3.87–5.11)
RDW: 11.9 % (ref 11.5–15.5)
WBC: 7.2 10*3/uL (ref 4.0–10.5)
nRBC: 0 % (ref 0.0–0.2)

## 2023-03-18 LAB — ECHOCARDIOGRAM COMPLETE
AR max vel: 2.73 cm2
AV Area VTI: 2.31 cm2
AV Area mean vel: 2.51 cm2
AV Mean grad: 3 mm[Hg]
AV Peak grad: 5.1 mm[Hg]
Ao pk vel: 1.13 m/s
Area-P 1/2: 2.83 cm2
Calc EF: 58.5 %
Height: 61 in
MV VTI: 1.89 cm2
S' Lateral: 2.8 cm
Single Plane A2C EF: 59.1 %
Single Plane A4C EF: 57.9 %
Weight: 3008 [oz_av]

## 2023-03-18 LAB — HIV ANTIBODY (ROUTINE TESTING W REFLEX): HIV Screen 4th Generation wRfx: NONREACTIVE

## 2023-03-18 LAB — COMPREHENSIVE METABOLIC PANEL
ALT: 41 U/L (ref 0–44)
AST: 38 U/L (ref 15–41)
Albumin: 4 g/dL (ref 3.5–5.0)
Alkaline Phosphatase: 70 U/L (ref 38–126)
Anion gap: 11 (ref 5–15)
BUN: 11 mg/dL (ref 8–23)
CO2: 22 mmol/L (ref 22–32)
Calcium: 9 mg/dL (ref 8.9–10.3)
Chloride: 105 mmol/L (ref 98–111)
Creatinine, Ser: 0.62 mg/dL (ref 0.44–1.00)
GFR, Estimated: >60 mL/min (ref >60)
Glucose, Bld: 105 mg/dL — ABNORMAL HIGH (ref 70–99)
Potassium: 3.7 mmol/L (ref 3.5–5.1)
Sodium: 138 mmol/L (ref 135–145)
Total Bilirubin: 1.3 mg/dL — ABNORMAL HIGH (ref 0.0–1.2)
Total Protein: 7.1 g/dL (ref 6.5–8.1)

## 2023-03-18 LAB — PROTIME-INR
INR: 1.1 (ref 0.8–1.2)
Prothrombin Time: 14.5 s (ref 11.4–15.2)

## 2023-03-18 LAB — APTT: aPTT: 29 s (ref 24–36)

## 2023-03-18 LAB — TSH: TSH: 2.493 u[IU]/mL (ref 0.350–4.500)

## 2023-03-18 LAB — TROPONIN I (HIGH SENSITIVITY)
Troponin I (High Sensitivity): 3 ng/L (ref <18)
Troponin I (High Sensitivity): <2 ng/L (ref <18)

## 2023-03-18 LAB — HEMOGLOBIN A1C
Hgb A1c MFr Bld: 4.9 % (ref 4.8–5.6)
Mean Plasma Glucose: 93.93 mg/dL

## 2023-03-18 LAB — T4, FREE: Free T4: 1.3 ng/dL — ABNORMAL HIGH (ref 0.61–1.12)

## 2023-03-18 MED ORDER — ATORVASTATIN CALCIUM 20 MG PO TABS
20.0000 mg | ORAL_TABLET | Freq: Every day | ORAL | Status: DC
  Administered 2023-03-18: 20 mg via ORAL
  Filled 2023-03-18: qty 1

## 2023-03-18 MED ORDER — AMLODIPINE BESYLATE 5 MG PO TABS: 5.0000 mg | ORAL_TABLET | Freq: Every day | ORAL | 0 refills | Status: AC

## 2023-03-18 MED ORDER — LEVOTHYROXINE SODIUM 50 MCG PO TABS: 150.0000 ug | ORAL_TABLET | Freq: Every day | ORAL | Status: DC

## 2023-03-18 MED ORDER — ASPIRIN 81 MG PO TBEC: 81.0000 mg | DELAYED_RELEASE_TABLET | Freq: Every day | ORAL | 12 refills | Status: AC

## 2023-03-18 MED ORDER — HYDRALAZINE HCL 20 MG/ML IJ SOLN: 10.0000 mg | INTRAMUSCULAR | Status: DC | PRN

## 2023-03-18 MED ORDER — MONTELUKAST SODIUM 10 MG PO TABS: 10.0000 mg | ORAL_TABLET | Freq: Every day | ORAL | Status: DC

## 2023-03-18 NOTE — Progress Notes (Signed)
*  PRELIMINARY RESULTS* Echocardiogram 2D Echocardiogram has been performed.  Stephanie Boyle 03/18/2023, 2:12 PM

## 2023-03-18 NOTE — Discharge Summary (Signed)
 Physician Discharge Summary   Patient: Stephanie Boyle MRN: [id number] DOB: 21-Dec-1960  Admit date:     03/17/2023  Discharge date: 03/18/23  Discharge Physician: Lorane Poland   PCP: Auston Reyes BIRCH, MD   Recommendations at discharge:   Referral to cardiology for new heart failure workup including stress test Follow-up with primary care physician for blood pressure management.  Discharge Diagnoses: Principal Problem:   Chest pain Active Problems:   Hypertensive emergency   Hypothyroidism   HLD (hyperlipidemia)   Obesity (BMI 30-39.9)  Resolved Problems:   * No resolved hospital problems. [facility] Course: [name] is a 63 year old female with hypertension well controlled without meds, hyperlipidemia, hypothyroidism, obesity, who presents with chest pain and pressure.  She reports the chest pressure as a squeezing-like pain that is nonradiating with associated shortness of breath.  She endorses prior to arrival she has also been experiencing some fatigue for the last several days.  On arrival to the ED blood pressure was found to be 210/84.  Troponin negative x 3.  EKG revealed poor R wave progression and left axis deviation with some ST changes potentially indicative of prior MI.  Patient's blood pressure resolved with minimal intervention (labetalol  x1) and she was initiated on amlodipine  daily.  By reevaluation on 1/10 patient denies any chest pain.  She reports feeling back to normal.  Given her concerning history I repeated a troponin once more which remained undetectable.  Echocardiogram was performed which revealed LVEF of 55 to 60%, no regional wall motion abnormalities, and moderate asymmetric left ventricular hypertrophy of the septal segment.  Ventricular diastolic parameters were also consistent with grade 1 diastolic dysfunction.  Heart failure is a new diagnosis for this patient.  This is likely secondary to hypertension.  However, reportedly (and confirmed on chart  review) patient has been off all blood pressure medications and BP has been well-controlled outpatient.  Is unclear what caused this sudden hypertensive urgency.  We will be discharging patient home today with amlodipine  and aspirin .  Have placed referral for cardiology so that she can follow-up and receive stress test and further workup given her new heart failure.  Patient was also given very strict return precautions and she endorses understanding.  She has follow-up appointment with her primary care physician already scheduled for early next week.  Consultants: n/a Procedures performed: n/a  Disposition: Home Diet recommendation:  Discharge Diet Orders (From admission, onward)     Start     Ordered   03/18/23 0000  Diet - low sodium heart healthy        03/18/23 1520           Cardiac diet DISCHARGE MEDICATION: Allergies as of 03/18/2023       Reactions   Penicillin V Potassium Hives, Nausea And Vomiting        Medication List     STOP taking these medications    hydrochlorothiazide 25 MG tablet Commonly known as: HYDRODIURIL   loratadine 10 MG tablet Commonly known as: CLARITIN   phentermine 37.5 MG capsule   Potassium 99 MG Tabs       TAKE these medications    amLODipine  5 MG tablet Commonly known as: NORVASC  Take 1 tablet (5 mg total) by mouth daily. Start taking on: March 19, 2023   aspirin  EC 81 MG tablet Take 1 tablet (81 mg total) by mouth daily. Swallow whole. Start taking on: March 19, 2023   atorvastatin  20 MG tablet Commonly known as:  LIPITOR Take 20 mg by mouth daily.   Calcium  Citrate-Vitamin D 315-5 MG-MCG Tabs Take 1 tablet by mouth daily.   cetirizine 10 MG tablet Commonly known as: ZYRTEC Take 10 mg by mouth daily.   furosemide 20 MG tablet Commonly known as: LASIX Take 20 mg by mouth daily as needed.   levothyroxine  100 MCG tablet Commonly known as: SYNTHROID  Take 100 mcg by mouth daily before breakfast.         Discharge Exam: Filed Weights   03/17/23 1635  Weight: 85.3 kg   Constitutional:  Normal appearance. Non toxic-appearing.  HENT: Head Normocephalic and atraumatic.  Mucous membranes are moist.  Eyes:  Extraocular intact. Conjunctivae normal. Pupils are equal, round, and reactive to light.  Cardiovascular: Rate and Rhythm: Normal rate and regular rhythm.  Pulmonary: Non labored, symmetric rise of chest wall.  Musculoskeletal:  Normal range of motion.  Skin: warm and dry. not jaundiced.  Neurological: No focal deficit present. alert. Oriented. Psychiatric: Mood and Affect congruent.    Condition at discharge: stable  The results of significant diagnostics from this hospitalization (including imaging, microbiology, ancillary and laboratory) are listed below for reference.   Imaging Studies: ECHOCARDIOGRAM COMPLETE Result Date: 03/18/2023    ECHOCARDIOGRAM REPORT   Patient Name:   Stephanie Boyle Date of Exam: 03/18/2023 Medical Rec #:  [id number]         Height:       61.0 in Accession #:    [id number]        Weight:       188.0 lb Date of Birth:  1961-01-31         BSA:          1.840 m Patient Age:    62 years          BP:           123/95 mmHg Patient Gender: F                 HR:           67 bpm. Exam Location:  [facility] Procedure: 2D Echo, Cardiac Doppler, Color Doppler and Strain Analysis STAT ECHO Indications:     Abnormal ECG  History:         Patient has no prior history of Echocardiogram examinations.                  Abnormal ECG, Signs/Symptoms:Chest Pain; Risk                  Factors:Hypertension and Dyslipidemia.  Sonographer:     Naomie Reef Referring Phys:  [id number] LORANE POLAND Diagnosing Phys: Lonni Hanson MD  Sonographer Comments: Global longitudinal strain was attempted. IMPRESSIONS  1. Left ventricular ejection fraction, by estimation, is 55 to 60%. The left ventricle has normal function. The left ventricle has no regional wall motion abnormalities. There is  moderate asymmetric left ventricular hypertrophy of the septal segment. Left ventricular diastolic parameters are consistent with Grade I diastolic dysfunction (impaired relaxation). The average left ventricular global longitudinal strain is -16.4 %. The global longitudinal strain is normal.  2. Right ventricular systolic function is normal. The right ventricular size is normal. Tricuspid regurgitation signal is inadequate for assessing PA pressure.  3. The mitral valve is normal in structure. Trivial mitral valve regurgitation. No evidence of mitral stenosis.  4. The aortic valve is tricuspid. There is mild thickening of the aortic valve. Aortic valve regurgitation is mild. Aortic valve sclerosis  is present, with no evidence of aortic valve stenosis.  5. The inferior vena cava is normal in size with greater than 50% respiratory variability, suggesting right atrial pressure of 3 mmHg. FINDINGS  Left Ventricle: Left ventricular ejection fraction, by estimation, is 55 to 60%. The left ventricle has normal function. The left ventricle has no regional wall motion abnormalities. The average left ventricular global longitudinal strain is -16.4 %. The global longitudinal strain is normal. The left ventricular internal cavity size was normal in size. There is moderate asymmetric left ventricular hypertrophy of the septal segment. Left ventricular diastolic parameters are consistent with Grade I diastolic dysfunction (impaired relaxation). Right Ventricle: The right ventricular size is normal. No increase in right ventricular wall thickness. Right ventricular systolic function is normal. Tricuspid regurgitation signal is inadequate for assessing PA pressure. Left Atrium: Left atrial size was normal in size. Right Atrium: Right atrial size was normal in size. Pericardium: There is no evidence of pericardial effusion. Mitral Valve: The mitral valve is normal in structure. Mild mitral annular calcification. Trivial mitral valve  regurgitation. No evidence of mitral valve stenosis. MV peak gradient, 4.7 mmHg. The mean mitral valve gradient is 2.0 mmHg. Tricuspid Valve: The tricuspid valve is normal in structure. Tricuspid valve regurgitation is not demonstrated. Aortic Valve: The aortic valve is tricuspid. There is mild thickening of the aortic valve. Aortic valve regurgitation is mild. Aortic valve sclerosis is present, with no evidence of aortic valve stenosis. Aortic valve mean gradient measures 3.0 mmHg. Aortic valve peak gradient measures 5.1 mmHg. Aortic valve area, by VTI measures 2.31 cm. Pulmonic Valve: The pulmonic valve was normal in structure. Pulmonic valve regurgitation is mild. No evidence of pulmonic stenosis. Aorta: The aortic root is normal in size and structure. Pulmonary Artery: The pulmonary artery is of normal size. Venous: The inferior vena cava is normal in size with greater than 50% respiratory variability, suggesting right atrial pressure of 3 mmHg. IAS/Shunts: The interatrial septum was not well visualized.  LEFT VENTRICLE PLAX 2D LVIDd:         4.30 cm     Diastology LVIDs:         2.80 cm     LV e' medial:    7.07 cm/s LV PW:         1.10 cm     LV E/e' medial:  10.7 LV IVS:        1.48 cm     LV e' lateral:   9.90 cm/s LVOT diam:     1.90 cm     LV E/e' lateral: 7.7 LV SV:         61 LV SV Index:   33          2D Longitudinal Strain LVOT Area:     2.84 cm    2D Strain GLS Avg:     -16.4 %  LV Volumes (MOD) LV vol d, MOD A2C: 52.8 ml LV vol d, MOD A4C: 62.3 ml LV vol s, MOD A2C: 21.6 ml LV vol s, MOD A4C: 26.2 ml LV SV MOD A2C:     31.2 ml LV SV MOD A4C:     62.3 ml LV SV MOD BP:      33.6 ml RIGHT VENTRICLE RV Basal diam:  3.05 cm RV Mid diam:    3.10 cm RV S prime:     12.80 cm/s TAPSE (M-mode): 2.6 cm LEFT ATRIUM  Index        RIGHT ATRIUM           Index LA diam:        3.30 cm 1.79 cm/m   RA Area:     14.60 cm LA Vol (A2C):   54.5 ml 29.62 ml/m  RA Volume:   38.20 ml  20.76 ml/m LA Vol  (A4C):   38.2 ml 20.76 ml/m LA Biplane Vol: 50.8 ml 27.61 ml/m  AORTIC VALVE                    PULMONIC VALVE AV Area (Vmax):    2.73 cm     PV Vmax:       1.20 m/s AV Area (Vmean):   2.51 cm     PV Peak grad:  5.8 mmHg AV Area (VTI):     2.31 cm AV Vmax:           113.00 cm/s AV Vmean:          74.700 cm/s AV VTI:            0.265 m AV Peak Grad:      5.1 mmHg AV Mean Grad:      3.0 mmHg LVOT Vmax:         109.00 cm/s LVOT Vmean:        66.000 cm/s LVOT VTI:          0.216 m LVOT/AV VTI ratio: 0.82  AORTA Ao Root diam: 3.30 cm MITRAL VALVE MV Area (PHT): 2.83 cm    SHUNTS MV Area VTI:   1.89 cm    Systemic VTI:  0.22 m MV Peak grad:  4.7 mmHg    Systemic Diam: 1.90 cm MV Mean grad:  2.0 mmHg MV Vmax:       1.08 m/s MV Vmean:      66.7 cm/s MV Decel Time: 268 msec MV E velocity: 76.00 cm/s MV A velocity: 94.30 cm/s MV E/A ratio:  0.81 Lonni End MD Electronically signed by Lonni Hanson MD Signature Date/Time: 03/18/2023/2:25:12 PM    Final    DG Chest 2 View Result Date: 03/17/2023 CLINICAL DATA:  Chest pain. EXAM: CHEST - 2 VIEW COMPARISON:  None Available. FINDINGS: With no focal consolidation, pleural effusion, or pneumothorax. The cardiac silhouette is within normal limits. No acute osseous pathology. IMPRESSION: No active cardiopulmonary disease. Electronically Signed   By: Vanetta Chou M.D.   On: 03/17/2023 17:12    Microbiology: Results for orders placed or performed during the hospital encounter of 03/19/19  SARS CORONAVIRUS 2 (TAT 6-24 HRS) Nasopharyngeal Nasopharyngeal Swab     Status: None   Collection Time: 03/19/19 10:17 AM   Specimen: Nasopharyngeal Swab  Result Value Ref Range Status   SARS Coronavirus 2 NEGATIVE NEGATIVE Final    Comment: (NOTE) SARS-CoV-2 target nucleic acids are NOT DETECTED. The SARS-CoV-2 RNA is generally detectable in upper and lower respiratory specimens during the acute phase of infection. Negative results do not preclude SARS-CoV-2  infection, do not rule out co-infections with other pathogens, and should not be used as the sole basis for treatment or other patient management decisions. Negative results must be combined with clinical observations, patient history, and epidemiological information. The expected result is Negative. Fact Sheet for Patients: hairslick.no Fact Sheet for Healthcare Providers: quierodirigir.com This test is not yet approved or cleared by the [place]  FDA and  has been authorized for detection and/or diagnosis of SARS-CoV-2 by FDA  under an Emergency Use Authorization (EUA). This EUA will remain  in effect (meaning this test can be used) for the duration of the COVID-19 declaration under Section 56 4(b)(1) of the Act, 21 U.S.C. section 360bbb-3(b)(1), unless the authorization is terminated or revoked sooner. Performed at [facility], [address] [place], [place] [address]     Labs: CBC: Recent Labs  Lab 03/17/23 1638 03/18/23 1004  WBC 9.3 7.2  NEUTROABS  --  5.0  HGB 15.3* 14.6  HCT 44.1 43.5  MCV 85.5 88.8  PLT 239 229   Basic Metabolic Panel: Recent Labs  Lab 03/17/23 1638 03/18/23 1004  NA 136 138  K 3.8 3.7  CL 103 105  CO2 23 22  GLUCOSE 128* 105*  BUN 11 11  CREATININE 0.68 0.62  CALCIUM  9.8 9.0   Liver Function Tests: Recent Labs  Lab 03/18/23 1004  AST 38  ALT 41  ALKPHOS 70  BILITOT 1.3*  PROT 7.1  ALBUMIN 4.0   CBG: No results for input(s): GLUCAP in the last 168 hours.  Discharge time spent: greater than 30 minutes.  Signed: Ashante Snelling, DO [facility] 03/18/2023

## 2023-03-18 NOTE — Hospital Course (Signed)
 Stephanie Boyle is a 63 year old female with hypertension well controlled without meds, hyperlipidemia, hypothyroidism, obesity, who presents with chest pain and pressure.  She reports the chest pressure as a squeezing-like pain that is nonradiating with associated shortness of breath.  She endorses prior to arrival she has also been experiencing some fatigue for the last several days.  On arrival to the ED blood pressure was found to be 210/84.  Troponin negative x 3.  EKG revealed poor R wave progression and left axis deviation with some ST changes potentially indicative of prior MI.  Patient's blood pressure resolved with minimal intervention (labetalol  x1) and she was initiated on amlodipine  daily.  By reevaluation on 1/10 patient denies any chest pain.  She reports feeling back to normal.  Given her concerning history I repeated a troponin once more which remained undetectable.  Echocardiogram was performed which revealed LVEF of 55 to 60%, no regional wall motion abnormalities, and moderate asymmetric left ventricular hypertrophy of the septal segment.  Ventricular diastolic parameters were also consistent with grade 1 diastolic dysfunction.  Heart failure is a new diagnosis for this patient.  This is likely secondary to hypertension.  However, reportedly (and confirmed on chart review) patient has been off all blood pressure medications and BP has been well-controlled outpatient.  Is unclear what caused this sudden hypertensive urgency.  We will be discharging patient home today with amlodipine  and aspirin .  Have placed referral for cardiology so that she can follow-up and receive stress test and further workup given her new heart failure.  Patient was also given very strict return precautions and she endorses understanding.  She has follow-up appointment with her primary care physician already scheduled for early next week.

## 2023-03-18 NOTE — ED Notes (Signed)
 Pt assisted to bathroom. Pt denies any chest pain or palpations. States she "feels a lot better."

## 2023-03-18 NOTE — ED Notes (Signed)
 Pt assisted to bathroom. Given hygiene products. Pt states she will walk around room for a little bit to stretch her legs.

## 2023-03-22 ENCOUNTER — Other Ambulatory Visit: Payer: Self-pay | Admitting: Internal Medicine

## 2023-03-22 DIAGNOSIS — Z1231 Encounter for screening mammogram for malignant neoplasm of breast: Secondary | ICD-10-CM

## 2023-04-01 ENCOUNTER — Ambulatory Visit
Admission: RE | Admit: 2023-04-01 | Discharge: 2023-04-01 | Disposition: A | Discharge status: Home / Self Care | Payer: Commercial Managed Care - PPO | Source: Ambulatory Visit | Attending: Internal Medicine | Admitting: Internal Medicine

## 2023-04-01 DIAGNOSIS — Z1231 Encounter for screening mammogram for malignant neoplasm of breast: Secondary | ICD-10-CM | POA: Diagnosis present

## 2023-05-18 ENCOUNTER — Other Ambulatory Visit: Payer: Self-pay | Admitting: Cardiology

## 2023-05-18 DIAGNOSIS — R0789 Other chest pain: Secondary | ICD-10-CM

## 2023-05-18 DIAGNOSIS — E7800 Pure hypercholesterolemia, unspecified: Secondary | ICD-10-CM

## 2023-05-18 DIAGNOSIS — R9439 Abnormal result of other cardiovascular function study: Secondary | ICD-10-CM

## 2023-05-18 DIAGNOSIS — I10 Essential (primary) hypertension: Secondary | ICD-10-CM

## 2023-05-27 ENCOUNTER — Telehealth (HOSPITAL_COMMUNITY): Payer: Self-pay | Admitting: *Deleted

## 2023-05-27 NOTE — Telephone Encounter (Signed)
 Attempted to call patient regarding upcoming cardiac CT appointment. Left message on voicemail with name and callback number Johney Frame RN Navigator Cardiac Imaging [facility] [phone] Office

## 2023-05-27 NOTE — Telephone Encounter (Signed)
 Received call from patient regarding upcoming cardiac imaging study; pt verbalizes understanding of appt date/time, parking situation and where to check in, pre-test NPO status and medications ordered, and verified current allergies; name and call back number provided for further questions should they arise Johney Frame RN Navigator Cardiac Imaging [facility] [phone] office [phone] cell

## 2023-05-30 ENCOUNTER — Ambulatory Visit
Admission: RE | Admit: 2023-05-30 | Discharge: 2023-05-30 | Disposition: A | Discharge status: Home / Self Care | Source: Ambulatory Visit | Attending: Cardiology | Admitting: Cardiology

## 2023-05-30 DIAGNOSIS — E7800 Pure hypercholesterolemia, unspecified: Secondary | ICD-10-CM | POA: Diagnosis present

## 2023-05-30 DIAGNOSIS — R0789 Other chest pain: Secondary | ICD-10-CM | POA: Insufficient documentation

## 2023-05-30 DIAGNOSIS — I10 Essential (primary) hypertension: Secondary | ICD-10-CM | POA: Diagnosis present

## 2023-05-30 DIAGNOSIS — R9439 Abnormal result of other cardiovascular function study: Secondary | ICD-10-CM | POA: Diagnosis present

## 2023-05-30 MED ORDER — DILTIAZEM HCL 25 MG/5ML IV SOLN: 10.0000 mg | INTRAVENOUS | Status: DC | PRN

## 2023-05-30 MED ORDER — METOPROLOL TARTRATE 5 MG/5ML IV SOLN: 10.0000 mg | INTRAVENOUS | Status: DC | PRN

## 2023-05-30 MED ORDER — NITROGLYCERIN 0.4 MG SL SUBL
0.8000 mg | SUBLINGUAL_TABLET | Freq: Once | SUBLINGUAL | Status: AC
  Administered 2023-05-30: 0.8 mg via SUBLINGUAL
  Filled 2023-05-30: qty 25

## 2023-05-30 MED ORDER — IOHEXOL 350 MG/ML SOLN
80.0000 mL | Freq: Once | INTRAVENOUS | Status: AC | PRN
  Administered 2023-05-30: 80 mL via INTRAVENOUS

## 2023-05-30 NOTE — Progress Notes (Signed)
 Patient tolerated procedure well. Ambulate w/o difficulty. Denies any lightheadedness or being dizzy. Pt denies any pain at this time. Sitting in chair. Pt is encouraged to drink additional water throughout the day and reason explained to patient. Patient verbalized understanding and all questions answered. ABC intact. No further needs at this time. Discharge from procedure area w/o issues.

## 2023-09-14 ENCOUNTER — Ambulatory Visit: Payer: Commercial Managed Care - PPO | Admitting: Dermatology

## 2023-09-14 ENCOUNTER — Encounter: Payer: Self-pay | Admitting: Dermatology

## 2023-09-14 DIAGNOSIS — Z7189 Other specified counseling: Secondary | ICD-10-CM

## 2023-09-14 DIAGNOSIS — D2362 Other benign neoplasm of skin of left upper limb, including shoulder: Secondary | ICD-10-CM

## 2023-09-14 DIAGNOSIS — D1801 Hemangioma of skin and subcutaneous tissue: Secondary | ICD-10-CM

## 2023-09-14 DIAGNOSIS — L578 Other skin changes due to chronic exposure to nonionizing radiation: Secondary | ICD-10-CM

## 2023-09-14 DIAGNOSIS — D492 Neoplasm of unspecified behavior of bone, soft tissue, and skin: Secondary | ICD-10-CM | POA: Diagnosis not present

## 2023-09-14 DIAGNOSIS — D485 Neoplasm of uncertain behavior of skin: Secondary | ICD-10-CM

## 2023-09-14 DIAGNOSIS — W908XXA Exposure to other nonionizing radiation, initial encounter: Secondary | ICD-10-CM | POA: Diagnosis not present

## 2023-09-14 DIAGNOSIS — Z808 Family history of malignant neoplasm of other organs or systems: Secondary | ICD-10-CM

## 2023-09-14 NOTE — Patient Instructions (Signed)

## 2023-09-14 NOTE — Progress Notes (Signed)
 New Patient Visit   Subjective  Stephanie Boyle is a 63 y.o. female who presents for the following: Irregular skin lesion on the R cheek x 5 years, no changes, no growth or bleeding; pt does not recall injury in area; pt concerned and would like checked today. Lesion on the L forearm x 1.5 years, no changes, turned white, appears to be a cyst. Would like checked today. Fhx of skin cancer, unsure of which type.  The following portions of the chart were reviewed this encounter and updated as appropriate: medications, allergies, medical history  Review of Systems:  No other skin or systemic complaints except as noted in HPI or Assessment and Plan.  Objective  Well appearing patient in no apparent distress; mood and affect are within normal limits.   A focused examination was performed of the following areas: the face and arms  Relevant exam findings are noted in the Assessment and Plan.  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 Epidermal / dermal shaving  Lesion diameter (cm):  0.8 Informed consent: discussed and consent obtained   Timeout: patient name, date of birth, surgical site, and procedure verified   Procedure prep:  Patient was prepped and draped in usual sterile fashion Prep type:  Isopropyl alcohol Anesthesia: the lesion was anesthetized in a standard fashion   Anesthetic:  1% lidocaine  w/ epinephrine 1-100,000 buffered w/ 8.4% NaHCO3 Instrument used: flexible razor blade   Hemostasis achieved with: pressure, aluminum chloride and electrodesiccation   Outcome: patient tolerated procedure well   Post-procedure details: sterile dressing applied and wound care instructions given   Dressing type: bandage (Mupirocin 2% ointment)   Additional details:  Curettage performed today   Specimen 1 - Surgical  pathology Differential Diagnosis: D48.5 r/o cyst vs dermatofibroma vs other 3 pieces Check Margins: No HEMANGIOMA OF SKIN R medial cheek / lateral nose (Does not appear to be a BCC but is in Ddx)  - Second opinion by Dr. Jackquline today, benign appearing under dermoscopy, by both physicians. Discussed biopsy vs Laser tx vs observation.  Pt prefers to try BBL/ Laser and if it does not respond to treatment or if abnormal changes, recommend doing a biopsy.  Counseling for BBL / IPL / Laser and Coordination of Care Discussed the treatment option of Broad Band Light (BBL) /Intense Pulsed Light (IPL)/ Laser for skin discoloration, including brown spots and redness.  Typically we recommend at least 1-3 treatment sessions about 5-8 weeks apart for best results.  Cannot have tanned skin when BBL performed, and regular use of sunscreen/photoprotection is advised after the procedure to help maintain results. The patient's condition may also require maintenance treatments in the future.  The fee for BBL / laser treatments is $350 per treatment session for the whole face.  A fee can be quoted for other parts of the body.  Insurance typically does not pay for BBL/laser treatments and therefore the fee is an out-of-pocket cost. Recommend prophylactic valtrex treatment. Once scheduled for procedure, will send Rx in prior to patient's appointment.   ACTINIC DAMAGE - chronic, secondary to cumulative UV radiation exposure/sun exposure over time - diffuse scaly erythematous macules with underlying dyspigmentation - Recommend daily broad spectrum sunscreen SPF 30+ to sun-exposed areas, reapply every 2 hours as needed.  - Recommend staying in the shade or  wearing long sleeves, sun glasses (UVA+UVB protection) and wide brim hats (4-inch brim around the entire circumference of the hat). - Call for new or changing lesions.  FAMILY HISTORY OF SKIN CANCER   COUNSELING AND COORDINATION OF CARE   ACTINIC SKIN  DAMAGE    Return for BBL on R cheek - one spot only, schedule within the next weeks.  Stephanie Boyle, CMA, am acting as scribe for Alm Rhyme, MD .  Documentation: I have reviewed the above documentation for accuracy and completeness, and I agree with the above.  Alm Rhyme, MD

## 2023-09-16 LAB — SURGICAL PATHOLOGY

## 2023-09-19 ENCOUNTER — Ambulatory Visit: Payer: Self-pay | Admitting: Dermatology

## 2023-09-19 NOTE — Telephone Encounter (Addendum)
 Tried calling patient regarding results. No answer. LM for patient to return call. ----- Message from Alm Rhyme sent at 09/19/2023  1:04 PM EDT ----- FINAL DIAGNOSIS        1. 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

## 2023-09-20 NOTE — Telephone Encounter (Addendum)
 Called and discussed bx results with patient. She verbalized understanding and denied further questions.   ----- Message from Alm Rhyme sent at 09/19/2023  1:04 PM EDT ----- FINAL DIAGNOSIS        1. 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

## 2023-10-13 ENCOUNTER — Ambulatory Visit (INDEPENDENT_AMBULATORY_CARE_PROVIDER_SITE_OTHER): Payer: Self-pay | Admitting: Dermatology

## 2023-10-13 ENCOUNTER — Encounter: Payer: Self-pay | Admitting: Dermatology

## 2023-10-13 DIAGNOSIS — D1801 Hemangioma of skin and subcutaneous tissue: Secondary | ICD-10-CM

## 2023-10-13 NOTE — Patient Instructions (Signed)

## 2023-10-13 NOTE — Progress Notes (Signed)
   Follow-Up Visit   Subjective  Stephanie Boyle is a 63 y.o. female who presents for the following: patient here for 1st BBL treatment for a Hemangioma on her right medial cheek/lateral nose.   The following portions of the chart were reviewed this encounter and updated as appropriate: medications, allergies, medical history  Review of Systems:  No other skin or systemic complaints except as noted in HPI or Assessment and Plan.  Objective  Well appearing patient in no apparent distress; mood and affect are within normal limits.  A focused examination was performed of the following areas: nose/cheek  Relevant exam findings are noted in the Assessment and Plan.  right medial cheek/lateral nose Blanching red papule 0.7 x 0.5 cm    Assessment & Plan   HEMANGIOMA OF SKIN right medial cheek/lateral nose  [place] BBL - 10/13/23 1400      Patient Details   Skin Type: I    Anesthestic Cream Applied: No    Photo Takes: Yes      Treatment Details   Date: 10/13/23    Treatment #: 1    Area: face    Filter: 1st Pass      1st Pass   Location: F    Device: 550 filter    BBL j/cm2: 32    PW Msec Sec: 20    Cooling Temp: 20    Pulses: 5    7mm: used this crystal    Laser safety: Patient was advised in laser safety.  Patient was fitted with laser safety goggles and advised to keep eyes closed during procedure with goggles on. Staff and provider ensured that patient and their own safety goggles were also on and eyes protected during procedure. Laser room door was secured and locked from the inside. Laser room door has laser safety sign affixed to the outside of the door.    Patient tolerated the procedure well.    Austin avoidance was stressed. The patient will call with any problems, questions or concerns prior to their next appointment.  BBL laser kit given today        Return in about 6 weeks (around 11/24/2023) for BBL/Hemangioma .  IFay Kirks, CMA, am acting as  scribe for Alm Rhyme, MD .   Documentation: I have reviewed the above documentation for accuracy and completeness, and I agree with the above.  Alm Rhyme, MD

## 2023-11-29 ENCOUNTER — Ambulatory Visit: Admitting: Dermatology
